# Patient Record
Sex: Male | Born: 1970 | Hispanic: No | Marital: Married | Smoking: Never smoker
Health system: Southern US, Community
[De-identification: ages and names within clinical notes are randomized; demographics above are authoritative.]

## PROBLEM LIST (undated history)

## (undated) DIAGNOSIS — I10 Essential (primary) hypertension: Secondary | ICD-10-CM

## (undated) DIAGNOSIS — E119 Type 2 diabetes mellitus without complications: Secondary | ICD-10-CM

## (undated) HISTORY — PX: NO PAST SURGERIES: SHX2092

---

## 2011-03-30 ENCOUNTER — Inpatient Hospital Stay (INDEPENDENT_AMBULATORY_CARE_PROVIDER_SITE_OTHER)
Admission: RE | Admit: 2011-03-30 | Discharge: 2011-03-30 | Disposition: A | Discharge status: Home / Self Care | Payer: Self-pay | Source: Ambulatory Visit | Attending: Emergency Medicine | Admitting: Emergency Medicine

## 2011-03-30 ENCOUNTER — Ambulatory Visit (INDEPENDENT_AMBULATORY_CARE_PROVIDER_SITE_OTHER): Payer: Self-pay

## 2011-03-30 DIAGNOSIS — J40 Bronchitis, not specified as acute or chronic: Secondary | ICD-10-CM

## 2011-03-30 DIAGNOSIS — M549 Dorsalgia, unspecified: Secondary | ICD-10-CM

## 2020-04-03 ENCOUNTER — Ambulatory Visit (INDEPENDENT_AMBULATORY_CARE_PROVIDER_SITE_OTHER): Payer: BC Managed Care – PPO | Admitting: Physician Assistant

## 2020-04-03 ENCOUNTER — Encounter: Payer: Self-pay | Admitting: Physician Assistant

## 2020-04-03 ENCOUNTER — Other Ambulatory Visit: Payer: Self-pay

## 2020-04-03 VITALS — BP 118/82 | HR 66 | Temp 97.7°F | Ht 64.5 in | Wt 155.0 lb

## 2020-04-03 DIAGNOSIS — E119 Type 2 diabetes mellitus without complications: Secondary | ICD-10-CM | POA: Diagnosis not present

## 2020-04-03 DIAGNOSIS — R5383 Other fatigue: Secondary | ICD-10-CM | POA: Diagnosis not present

## 2020-04-03 LAB — POCT URINALYSIS DIPSTICK
Bilirubin, UA: NEGATIVE
Blood, UA: NEGATIVE
Glucose, UA: POSITIVE — AB
Ketones, UA: NEGATIVE
Leukocytes, UA: NEGATIVE
Nitrite, UA: NEGATIVE
Protein, UA: NEGATIVE
Spec Grav, UA: 1.015 (ref 1.010–1.025)
Urobilinogen, UA: NEGATIVE E.U./dL — AB
pH, UA: 6 (ref 5.0–8.0)

## 2020-04-03 LAB — POCT UA - MICROALBUMIN: Microalbumin Ur, POC: NEGATIVE mg/L

## 2020-04-03 LAB — POCT CBG (FASTING - GLUCOSE)-MANUAL ENTRY: Glucose Fasting, POC: 472 mg/dL — AB (ref 70–99)

## 2020-04-03 MED ORDER — CONTOUR NEXT MONITOR W/DEVICE KIT: PACK | 0 refills | Status: AC

## 2020-04-03 MED ORDER — GLUCOSE BLOOD VI STRP: ORAL_STRIP | 1 refills | Status: DC

## 2020-04-03 MED ORDER — GLUCOSE BLOOD VI STRP: ORAL_STRIP | 1 refills | Status: AC

## 2020-04-03 MED ORDER — LANCETS MISC: 1 refills | Status: DC

## 2020-04-03 MED ORDER — CONTOUR NEXT MONITOR W/DEVICE KIT: PACK | 0 refills | Status: DC

## 2020-04-03 MED ORDER — LANCETS MISC: 1 refills | Status: AC

## 2020-04-03 MED ORDER — METFORMIN HCL 500 MG PO TABS: 500.0000 mg | ORAL_TABLET | Freq: Two times a day (BID) | ORAL | 2 refills | Status: DC

## 2020-04-03 NOTE — Progress Notes (Signed)
Acute Office Visit  Subjective:    Patient ID: Terry Bruce, male    DOB: 1971-04-06, 49 y.o.   MRN: [id number]  Chief Complaint  Patient presents with   Not feeling well    Fatigue, excessive thirst, frequent urination, vision changes    HPI Patient is in today for fatigue, visual changes and excessive thirst - all of these symptoms started in the past 2 weeks He has not been checking his glucose but does have a family history of IDDM Pt recently had eye exam in the past week  History reviewed. No pertinent past medical history.  History reviewed. No pertinent surgical history.  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  Tobacco Use   Smoking status: Never Smoker   Smokeless tobacco: Never Used  Scientific laboratory technician Use: Never used  Substance and Sexual Activity   Alcohol use: Not Currently   Drug use: Never   Sexual activity: Not on file  Other Topics Concern   Not on file  Social History Narrative   Not on file   Social Determinants of Health   Financial Resource Strain:    Difficulty of Paying Living Expenses:   Food Insecurity:    Worried About Charity fundraiser in the Last Year:    Arboriculturist in the Last Year:   Transportation Needs:    Film/video editor (Medical):    Lack of Transportation (Non-Medical):   Physical Activity:    Days of Exercise per Week:    Minutes of Exercise per Session:   Stress:    Feeling of Stress :   Social Connections:    Frequency of Communication with Friends and Family:    Frequency of Social Gatherings with Friends and Family:    Attends Religious Services:     Active Member of Clubs or Organizations:    Attends Music therapist:    Marital Status:   Intimate Partner Violence:    Fear of Current or Ex-Partner:    Emotionally Abused:    Physically Abused:    Sexually Abused:      Current Outpatient Medications:    Blood Glucose Monitoring Suppl (CONTOUR NEXT MONITOR) w/Device KIT, Check glucose fasting once daily in the morning., Disp: 1 kit, Rfl: 0   glucose blood test strip, Check glucose fasting once in the morning daily., Disp: 100 each, Rfl: 1   Lancets MISC, Check glucose fasting once in the morning., Disp: 100 each, Rfl: 1   metFORMIN (GLUCOPHAGE) 500 MG tablet, Take 1 tablet (500 mg total) by mouth 2 (two) times daily with a meal., Disp: 60 tablet, Rfl: 2   No Known Allergies  CONSTITUTIONAL: see HPI E/N/T: Negative for ear pain, nasal congestion and sore throat.  CARDIOVASCULAR: Negative for chest pain, dizziness, palpitations and pedal edema.  RESPIRATORY: Negative for recent cough and dyspnea.  GASTROINTESTINAL: Negative for abdominal pain, acid reflux symptoms, constipation, diarrhea, nausea and vomiting.  GU - polyuria and nocturia MSK: Negative for arthralgias and myalgias.  INTEGUMENTARY: Negative for rash.  NEUROLOGICAL: Negative for dizziness and headaches.  PSYCHIATRIC: Negative for sleep disturbance and to question depression screen.  Negative for depression, negative for anhedonia.         Objective:    PHYSICAL EXAM:   VS: BP 118/82 (BP Location: Right Arm, Patient Position: Sitting)    Pulse 66    Temp 97.7 F (36.5 C) (Temporal)    Ht 5' 4.5" (1.638 m)    Wt 155 lb (70.3 kg)    SpO2 97%    BMI 26.19 kg/m   GEN: Well nourished, well developed, in no acute distress   Cardiac: RRR; no murmurs, rubs, or gallops,no edema - no significant varicosities Respiratory:  normal respiratory rate and pattern with no distress - normal breath sounds with no rales, rhonchi, wheezes or rubs GI: normal  bowel sounds, no masses or tenderness Skin: warm and dry, no rash  Psych: euthymic mood, appropriate affect and demeanor    Office Visit on 04/03/2020  Component Date Value Ref Range Status   Glucose, UA 04/03/2020 Positive* Negative Final   Bilirubin, UA 04/03/2020 Negative   Final   Ketones, UA 04/03/2020 Negative   Final   Spec Grav, UA 04/03/2020 1.015  1.010 - 1.025 Final   Blood, UA 04/03/2020 Negative   Final   pH, UA 04/03/2020 6.0  5.0 - 8.0 Final   Protein, UA 04/03/2020 Negative  Negative Final   Urobilinogen, UA 04/03/2020 negative* 0.2 or 1.0 E.U./dL Final   Nitrite, UA 04/03/2020 Negative   Final   Leukocytes, UA 04/03/2020 Negative  Negative Final   Glucose Fasting, POC 04/03/2020 472* 70 - 99 mg/dL Final    Wt Readings from Last 3 Encounters:  04/03/20 155 lb (70.3 kg)    Health Maintenance Due  Topic Date Due   Hepatitis C Screening  Never done   URINE MICROALBUMIN  Never done   COVID-19 Vaccine (1) Never done   HIV Screening  Never done    There are no preventive care reminders to display for this patient.        Assessment & Plan:   Problem List Items Addressed This Visit      Other   Other fatigue - Primary   Relevant Orders   POCT urinalysis dipstick (Completed)   POCT CBG (Fasting - Glucose) (Completed)   CBC with Differential/Platelet   Comprehensive metabolic panel   TSH   Lipid panel   Hemoglobin A1c    Other Visit Diagnoses    New onset type 2 diabetes mellitus (HCC)       Relevant Medications   metFORMIN (GLUCOPHAGE) 500 MG tablet   Blood Glucose Monitoring Suppl (CONTOUR NEXT MONITOR) w/Device KIT   glucose blood test strip   Lancets MISC   Other Relevant Orders   CBC with Differential/Platelet   Comprehensive metabolic panel   TSH   Lipid panel   Hemoglobin A1c       Meds ordered this encounter  Medications   metFORMIN (GLUCOPHAGE) 500 MG tablet    Sig: Take 1 tablet (500 mg total) by mouth 2 (two)  times daily with a meal.    Dispense:  60 tablet    Refill:  2    Order Specific Question:   Supervising Provider    Answer:   Shelton Silvas   Blood Glucose Monitoring Suppl (CONTOUR NEXT MONITOR) w/Device KIT    Sig: Check glucose fasting once daily in the morning.    Dispense:  1 kit    Refill:  0  Please use Diagnosis Code- E11.9 Diabetes type 2   glucose blood test strip    Sig: Check glucose fasting once in the morning daily.    Dispense:  100 each    Refill:  1    Please use Diagnosis Code- E11.9 Diabetes type 2   Lancets MISC    Sig: Check glucose fasting once in the morning.    Dispense:  100 each    Refill:  1    Please use Diagnosis Code- E11.9 Diabetes type 2     SARA R Sarayah Bacchi, PA-C

## 2020-04-03 NOTE — Assessment & Plan Note (Addendum)
Diabetes education handouts given rx for glucometer and strips to start checking bid rx to start glucophage 500mg  bid Follow up in 2 weeks

## 2020-04-03 NOTE — Assessment & Plan Note (Signed)
labwork pending 

## 2020-04-03 NOTE — Patient Instructions (Signed)

## 2020-04-04 LAB — COMPREHENSIVE METABOLIC PANEL
ALT: 19 IU/L (ref 0–44)
AST: 16 IU/L (ref 0–40)
Albumin/Globulin Ratio: 1.6 (ref 1.2–2.2)
Albumin: 4.5 g/dL (ref 4.0–5.0)
Alkaline Phosphatase: 115 IU/L (ref 48–121)
BUN/Creatinine Ratio: 18 (ref 9–20)
BUN: 29 mg/dL — ABNORMAL HIGH (ref 6–24)
Bilirubin Total: 0.3 mg/dL (ref 0.0–1.2)
CO2: 22 mmol/L (ref 20–29)
Calcium: 9.5 mg/dL (ref 8.7–10.2)
Chloride: 95 mmol/L — ABNORMAL LOW (ref 96–106)
Creatinine, Ser: 1.57 mg/dL — ABNORMAL HIGH (ref 0.76–1.27)
GFR calc Af Amer: 60 mL/min/{1.73_m2} (ref >59)
GFR calc non Af Amer: 52 mL/min/{1.73_m2} — ABNORMAL LOW (ref >59)
Globulin, Total: 2.9 g/dL (ref 1.5–4.5)
Glucose: 430 mg/dL — ABNORMAL HIGH (ref 65–99)
Potassium: 5 mmol/L (ref 3.5–5.2)
Sodium: 131 mmol/L — ABNORMAL LOW (ref 134–144)
Total Protein: 7.4 g/dL (ref 6.0–8.5)

## 2020-04-04 LAB — HEMOGLOBIN A1C
Est. average glucose Bld gHb Est-mCnc: 263 mg/dL
Hgb A1c MFr Bld: 10.8 % — ABNORMAL HIGH (ref 4.8–5.6)

## 2020-04-04 LAB — CBC WITH DIFFERENTIAL/PLATELET
Basophils Absolute: 0 10*3/uL (ref 0.0–0.2)
Basos: 1 %
EOS (ABSOLUTE): 0.2 10*3/uL (ref 0.0–0.4)
Eos: 3 %
Hematocrit: 46.1 % (ref 37.5–51.0)
Hemoglobin: 15.1 g/dL (ref 13.0–17.7)
Immature Grans (Abs): 0 10*3/uL (ref 0.0–0.1)
Immature Granulocytes: 0 %
Lymphocytes Absolute: 2 10*3/uL (ref 0.7–3.1)
Lymphs: 34 %
MCH: 28.5 pg (ref 26.6–33.0)
MCHC: 32.8 g/dL (ref 31.5–35.7)
MCV: 87 fL (ref 79–97)
Monocytes Absolute: 0.5 10*3/uL (ref 0.1–0.9)
Monocytes: 8 %
Neutrophils Absolute: 3.2 10*3/uL (ref 1.4–7.0)
Neutrophils: 54 %
Platelets: 266 10*3/uL (ref 150–450)
RBC: 5.3 x10E6/uL (ref 4.14–5.80)
RDW: 12.8 % (ref 11.6–15.4)
WBC: 5.9 10*3/uL (ref 3.4–10.8)

## 2020-04-04 LAB — LIPID PANEL
Chol/HDL Ratio: 3.9 ratio (ref 0.0–5.0)
Cholesterol, Total: 210 mg/dL — ABNORMAL HIGH (ref 100–199)
HDL: 54 mg/dL (ref >39)
LDL Chol Calc (NIH): 137 mg/dL — ABNORMAL HIGH (ref 0–99)
Triglycerides: 108 mg/dL (ref 0–149)
VLDL Cholesterol Cal: 19 mg/dL (ref 5–40)

## 2020-04-04 LAB — TSH: TSH: 0.864 u[IU]/mL (ref 0.450–4.500)

## 2020-04-04 LAB — CARDIOVASCULAR RISK ASSESSMENT

## 2020-04-11 ENCOUNTER — Other Ambulatory Visit: Payer: Self-pay

## 2020-04-11 ENCOUNTER — Encounter: Payer: Self-pay | Admitting: Physician Assistant

## 2020-04-11 ENCOUNTER — Ambulatory Visit: Payer: BC Managed Care – PPO | Admitting: Physician Assistant

## 2020-04-11 VITALS — BP 102/72 | HR 64 | Temp 97.9°F | Ht 64.5 in | Wt 155.0 lb

## 2020-04-11 DIAGNOSIS — E119 Type 2 diabetes mellitus without complications: Secondary | ICD-10-CM | POA: Diagnosis not present

## 2020-04-11 NOTE — Progress Notes (Signed)
Acute Office Visit  Subjective:    Patient ID: Terry Bruce, male    DOB: 04/08/1971, 49 y.o.   MRN: [id number]  Chief Complaint  Patient presents with  . Diabetes    New Diabetes 1 month follow up    HPI Patient is in today for follow up diabetes - pt states he is doing well on his current medication and tolerating well - his glucose has been ranging now between 80s-130s Pt is due to recheck cmp today as well Voices no other concerns or problems  History reviewed. No pertinent past medical history.  History reviewed. No pertinent surgical history.  Family History  Problem Relation Age of Onset  . Diabetes Mellitus II Mother   . Arthritis Father     Social History   Socioeconomic History  . Marital status: Married    Spouse name: Not on file  . Number of children: 2  . Years of education: Not on file  . Highest education level: Not on file  Occupational History  . 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 Smoking status: Never Smoker  . Smokeless tobacco: Never Used  Vaping Use  . Vaping Use: Never used  Substance and Sexual Activity  . Alcohol use: Not Currently  . Drug use: Never  . Sexual activity: Not on file  Other Topics Concern  . Not on file  Social History Narrative  . Not on file   Social Determinants of Health   Financial Resource Strain:   . Difficulty of Paying Living Expenses:   Food Insecurity:   . Worried About Charity fundraiser in the Last Year:   . Arboriculturist in the Last Year:   Transportation Needs:   . Film/video editor (Medical):   Marland Kitchen Lack of Transportation (Non-Medical):   Physical Activity:   . Days of Exercise per Week:   . Minutes of Exercise per Session:   Stress:   . Feeling of Stress :   Social Connections:   . Frequency of Communication with Friends and Family:   . Frequency of Social Gatherings with Friends and Family:   . Attends Religious Services:   . Active Member of Clubs or  Organizations:   . Attends Archivist Meetings:   Marland Kitchen Marital Status:   Intimate Partner Violence:   . Fear of Current or Ex-Partner:   . Emotionally Abused:   Marland Kitchen Physically Abused:   . Sexually Abused:      Current Outpatient Medications:  .  Blood Glucose Monitoring Suppl (CONTOUR NEXT MONITOR) w/Device KIT, Check glucose fasting in am and once after supper in pm, Disp: 1 kit, Rfl: 0 .  glucose blood test strip, Check glucose bid, Disp: 100 each, Rfl: 1 .  Lancets MISC, Check glucose bid, Disp: 100 each, Rfl: 1 .  metFORMIN (GLUCOPHAGE) 500 MG tablet, Take 1 tablet (500 mg total) by mouth 2 (two) times daily with a meal., Disp: 60 tablet, Rfl: 2   No Known Allergies  CONSTITUTIONAL: Negative for chills, fatigue, fever, unintentional weight gain and unintentional weight loss.  CARDIOVASCULAR: Negative for chest pain, dizziness, palpitations and pedal edema.  RESPIRATORY: Negative for recent cough and dyspnea.  GASTROINTESTINAL: Negative for abdominal pain, acid reflux symptoms, constipation, diarrhea, nausea and vomiting.  PSYCHIATRIC: Negative for sleep disturbance and to question depression screen.  Negative for depression, negative for anhedonia.         Objective:    PHYSICAL EXAM:  VS: BP 102/72 (BP Location: Left Arm, Patient Position: Sitting)   Pulse 64   Temp 97.9 F (36.6 C) (Temporal)   Ht 5' 4.5" (1.638 m)   Wt 155 lb (70.3 kg)   SpO2 97%   BMI 26.19 kg/m   GEN: Well nourished, well developed, in no acute distress  Cardiac: RRR; no murmurs, rubs, or gallops,no edema -  Respiratory:  normal respiratory rate and pattern with no distress - normal breath sounds with no rales, rhonchi, wheezes or rubs Psych: euthymic mood, appropriate affect and demeanor   Wt Readings from Last 3 Encounters:  04/11/20 155 lb (70.3 kg)  04/03/20 155 lb (70.3 kg)    Health Maintenance Due  Topic Date Due  . Hepatitis C Screening  Never done  . PNEUMOCOCCAL  POLYSACCHARIDE VACCINE AGE 66-64 HIGH RISK  Never done  . FOOT EXAM  Never done  . COVID-19 Vaccine (1) Never done  . HIV Screening  Never done  . INFLUENZA VACCINE  04/06/2020    There are no preventive care reminders to display for this patient.        Assessment & Plan:   Problem List Items Addressed This Visit      Endocrine   New onset type 2 diabetes mellitus ([place]) - Primary    Continue current meds as directed Follow up fasting in 2 months      Relevant Orders   Comprehensive metabolic panel       No orders of the defined types were placed in this encounter.    SARA R Kylo Gavin, PA-C

## 2020-04-11 NOTE — Assessment & Plan Note (Signed)
Continue current meds as directed Follow up fasting in 2 months

## 2020-04-12 LAB — COMPREHENSIVE METABOLIC PANEL
ALT: 16 IU/L (ref 0–44)
AST: 20 IU/L (ref 0–40)
Albumin/Globulin Ratio: 1.4 (ref 1.2–2.2)
Albumin: 4.1 g/dL (ref 4.0–5.0)
Alkaline Phosphatase: 68 IU/L (ref 48–121)
BUN/Creatinine Ratio: 15 (ref 9–20)
BUN: 20 mg/dL (ref 6–24)
Bilirubin Total: 0.5 mg/dL (ref 0.0–1.2)
CO2: 24 mmol/L (ref 20–29)
Calcium: 9 mg/dL (ref 8.7–10.2)
Chloride: 103 mmol/L (ref 96–106)
Creatinine, Ser: 1.37 mg/dL — ABNORMAL HIGH (ref 0.76–1.27)
GFR calc Af Amer: 70 mL/min/{1.73_m2} (ref >59)
GFR calc non Af Amer: 61 mL/min/{1.73_m2} (ref >59)
Globulin, Total: 2.9 g/dL (ref 1.5–4.5)
Glucose: 113 mg/dL — ABNORMAL HIGH (ref 65–99)
Potassium: 4.8 mmol/L (ref 3.5–5.2)
Sodium: 139 mmol/L (ref 134–144)
Total Protein: 7 g/dL (ref 6.0–8.5)

## 2020-04-21 ENCOUNTER — Ambulatory Visit: Payer: BC Managed Care – PPO | Admitting: Physician Assistant

## 2020-06-11 ENCOUNTER — Ambulatory Visit (INDEPENDENT_AMBULATORY_CARE_PROVIDER_SITE_OTHER): Payer: Self-pay | Admitting: Physician Assistant

## 2020-06-11 ENCOUNTER — Other Ambulatory Visit: Payer: Self-pay

## 2020-06-11 ENCOUNTER — Encounter: Payer: Self-pay | Admitting: Physician Assistant

## 2020-06-11 VITALS — BP 112/72 | HR 61 | Temp 97.5°F | Ht 64.5 in | Wt 152.4 lb

## 2020-06-11 DIAGNOSIS — E119 Type 2 diabetes mellitus without complications: Secondary | ICD-10-CM

## 2020-06-11 NOTE — Progress Notes (Signed)
Acute Office Visit  Subjective:    Patient ID: Terry Bruce, male    DOB: 07-29-1971, 49 y.o.   MRN: [id number]  Chief Complaint  Patient presents with  . Diabetes    HPI Patient is in today for follow up diabetes - pt states he is doing well on his current medication and tolerating well - his fasting glucose has been ranging now between 80s-130s He still gets elevations over 200 postprandial esp when not eating well (rice, cake, etc) Voices no other concerns or problems  History reviewed. No pertinent past medical history.  History reviewed. No pertinent surgical history.  Family History  Problem Relation Age of Onset  . Diabetes Mellitus II Mother   . Arthritis Father     Social History   Socioeconomic History  . Marital status: Married    Spouse name: Not on file  . Number of children: 2  . Years of education: Not on file  . Highest education level: Not on file  Occupational History  . 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 Smoking status: Never Smoker  . Smokeless tobacco: Never Used  Vaping Use  . Vaping Use: Never used  Substance and Sexual Activity  . Alcohol use: Not Currently  . Drug use: Never  . Sexual activity: Not on file  Other Topics Concern  . Not on file  Social History Narrative  . Not on file   Social Determinants of Health   Financial Resource Strain:   . Difficulty of Paying Living Expenses: Not on file  Food Insecurity:   . Worried About Charity fundraiser in the Last Year: Not on file  . Ran Out of Food in the Last Year: Not on file  Transportation Needs:   . Lack of Transportation (Medical): Not on file  . Lack of Transportation (Non-Medical): Not on file  Physical Activity:   . Days of Exercise per Week: Not on file  . Minutes of Exercise per Session: Not on file  Stress:   . Feeling of Stress : Not on file  Social Connections:   . Frequency of Communication with Friends and Family: Not on file  .  Frequency of Social Gatherings with Friends and Family: Not on file  . Attends Religious Services: Not on file  . Active Member of Clubs or Organizations: Not on file  . Attends Archivist Meetings: Not on file  . Marital Status: Not on file  Intimate Partner Violence:   . Fear of Current or Ex-Partner: Not on file  . Emotionally Abused: Not on file  . Physically Abused: Not on file  . Sexually Abused: Not on file     Current Outpatient Medications:  .  Blood Glucose Monitoring Suppl (CONTOUR NEXT MONITOR) w/Device KIT, Check glucose fasting in am and once after supper in pm, Disp: 1 kit, Rfl: 0 .  glucose blood test strip, Check glucose bid, Disp: 100 each, Rfl: 1 .  Lancets MISC, Check glucose bid, Disp: 100 each, Rfl: 1 .  metFORMIN (GLUCOPHAGE) 500 MG tablet, Take 1 tablet (500 mg total) by mouth 2 (two) times daily with a meal., Disp: 60 tablet, Rfl: 2   No Known Allergies  CONSTITUTIONAL: Negative for chills, fatigue, fever, unintentional weight gain and unintentional weight loss.  CARDIOVASCULAR: Negative for chest pain, dizziness, palpitations and pedal edema.  RESPIRATORY: Negative for recent cough and dyspnea.  GASTROINTESTINAL: Negative for abdominal pain, acid reflux symptoms, constipation, diarrhea, nausea and  vomiting.  PSYCHIATRIC: Negative for sleep disturbance and to question depression screen.  Negative for depression, negative for anhedonia.         Objective:    PHYSICAL EXAM:   VS: BP 112/72 (BP Location: Left Arm, Patient Position: Sitting, Cuff Size: Normal)   Pulse 61   Temp (!) 97.5 F (36.4 C) (Temporal)   Ht 5' 4.5" (1.638 m)   Wt 152 lb 6.4 oz (69.1 kg)   SpO2 99%   BMI 25.76 kg/m   GEN: Well nourished, well developed, in no acute distress  Cardiac: RRR; no murmurs, rubs, or gallops,no edema -  Respiratory:  normal respiratory rate and pattern with no distress - normal breath sounds with no rales, rhonchi, wheezes or rubs Psych:  euthymic mood, appropriate affect and demeanor   Wt Readings from Last 3 Encounters:  06/11/20 152 lb 6.4 oz (69.1 kg)  04/11/20 155 lb (70.3 kg)  04/03/20 155 lb (70.3 kg)    Health Maintenance Due  Topic Date Due  . PNEUMOCOCCAL POLYSACCHARIDE VACCINE AGE 18-64 HIGH RISK  Never done  . INFLUENZA VACCINE  Never done    There are no preventive care reminders to display for this patient.        Assessment & Plan:   Problem List Items Addressed This Visit      Endocrine   New onset type 2 diabetes mellitus ([place]) - Primary    Continue current meds Continue to monitor glucose bid Follow up as scheduled          No orders of the defined types were placed in this encounter.    SARA R Tyree Fluharty, PA-C

## 2020-06-11 NOTE — Assessment & Plan Note (Signed)
Continue current meds Continue to monitor glucose bid Follow up as scheduled

## 2020-08-21 ENCOUNTER — Ambulatory Visit: Payer: Self-pay | Admitting: Physician Assistant

## 2020-08-28 ENCOUNTER — Encounter: Payer: Self-pay | Admitting: Physician Assistant

## 2020-08-28 ENCOUNTER — Ambulatory Visit: Payer: BC Managed Care – PPO | Admitting: Physician Assistant

## 2020-08-28 ENCOUNTER — Other Ambulatory Visit: Payer: Self-pay

## 2020-08-28 VITALS — BP 110/76 | HR 71 | Temp 97.0°F | Ht 64.5 in | Wt 148.2 lb

## 2020-08-28 DIAGNOSIS — R634 Abnormal weight loss: Secondary | ICD-10-CM | POA: Diagnosis not present

## 2020-08-28 DIAGNOSIS — E119 Type 2 diabetes mellitus without complications: Secondary | ICD-10-CM

## 2020-08-28 DIAGNOSIS — Z23 Encounter for immunization: Secondary | ICD-10-CM

## 2020-08-28 MED ORDER — METFORMIN HCL 1000 MG PO TABS: 1000.0000 mg | ORAL_TABLET | Freq: Two times a day (BID) | ORAL | 1 refills | Status: DC

## 2020-08-28 NOTE — Progress Notes (Signed)
Acute Office Visit  Subjective:    Patient ID: Terry Bruce, male    DOB: 02-Aug-1971, 49 y.o.   MRN: [id number]  Chief Complaint  Patient presents with   Diabetes    Follow up    HPI Patient is in today for follow up of diabetes - he is currently taking glucophage 500 mg bid - he is not watching diet or exercising - states he will tighten up after first of year His glucose has been ranging in 200s fasting and random glucose up in 300s at times  Patient has lost over 20 pounds this year - he states his appetite is good - denies nausea or vomiting, no melena or hematochezia - no family history of colorectal cancers Discussed with him with having diabetes newly found weight loss can happen - will continue to monitor however  History reviewed. No pertinent past medical history.  History reviewed. No pertinent surgical history.  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  Tobacco Use   Smoking status: Never Smoker   Smokeless tobacco: Never Used  Scientific laboratory technician Use: Never used  Substance and Sexual Activity   Alcohol use: Not Currently   Drug use: Never   Sexual activity: Not on file  Other Topics Concern   Not on file  Social History Narrative   Not on file   Social Determinants of Health   Financial Resource Strain: Not on file  Food Insecurity: Not on file  Transportation Needs: Not on file  Physical Activity: Not on file  Stress: Not on file  Social Connections: Not on file  Intimate Partner Violence: Not on file    Outpatient Medications Prior to Visit  Medication Sig Dispense Refill   Blood Glucose Monitoring Suppl  (CONTOUR NEXT MONITOR) w/Device KIT Check glucose fasting in am and once after supper in pm 1 kit 0   glucose blood test strip Check glucose bid 100 each 1   Lancets MISC Check glucose bid 100 each 1   metFORMIN (GLUCOPHAGE) 500 MG tablet Take 1 tablet (500 mg total) by mouth 2 (two) times daily with a meal. 60 tablet 2   No facility-administered medications prior to visit.    No Known Allergies  Review of Systems    CONSTITUTIONAL: see HPI E/N/T: Negative for ear pain, nasal congestion and sore throat.  CARDIOVASCULAR: Negative for chest pain, dizziness, palpitations and pedal edema.  RESPIRATORY: Negative for recent cough and dyspnea.  GASTROINTESTINAL: Negative for abdominal pain, acid reflux symptoms, constipation, diarrhea, nausea and vomiting.  MSK: Negative for arthralgias and myalgias.  INTEGUMENTARY: Negative for rash.  NEUROLOGICAL: Negative for dizziness and headaches.  PSYCHIATRIC: Negative for sleep disturbance and to question depression screen.  Negative for depression, negative for anhedonia.      Objective:    Physical Exam PHYSICAL EXAM:   VS: BP 110/76 (BP Location: Left Arm, Patient Position: Sitting, Cuff Size: Normal)    Pulse 71    Temp (!) 97 F (36.1 C) (Temporal)    Ht 5' 4.5" (1.638 m)    Wt 148 lb 3.2 oz (  67.2 kg)    SpO2 99%    BMI 25.05 kg/m   GEN: Well nourished, well developed, in no acute distress  Cardiac: RRR; no murmurs, rubs, or gallops,no edema - Respiratory:  normal respiratory rate and pattern with no distress - normal breath sounds with no rales, rhonchi, wheezes or rubs  Skin: warm and dry, no rash  Neuro:  Alert and Oriented x 3, Strength and sensation are intact - CN II-Xii grossly intact Psych: euthymic mood, appropriate affect and demeanor  BP 110/76 (BP Location: Left Arm, Patient Position: Sitting, Cuff Size: Normal)    Pulse 71    Temp (!) 97 F (36.1 C) (Temporal)    Ht 5' 4.5" (1.638 m)    Wt 148 lb 3.2 oz (67.2 kg)    SpO2  99%    BMI 25.05 kg/m  Wt Readings from Last 3 Encounters:  08/28/20 148 lb 3.2 oz (67.2 kg)  06/11/20 152 lb 6.4 oz (69.1 kg)  04/11/20 155 lb (70.3 kg)    Health Maintenance Due  Topic Date Due   PNEUMOCOCCAL POLYSACCHARIDE VACCINE AGE 85-64 HIGH RISK  Never done   INFLUENZA VACCINE  Never done    There are no preventive care reminders to display for this patient.   Lab Results  Component Value Date   TSH 0.864 04/03/2020   Lab Results  Component Value Date   WBC 5.9 04/03/2020   HGB 15.1 04/03/2020   HCT 46.1 04/03/2020   MCV 87 04/03/2020   PLT 266 04/03/2020   Lab Results  Component Value Date   NA 139 04/11/2020   K 4.8 04/11/2020   CO2 24 04/11/2020   GLUCOSE 113 (H) 04/11/2020   BUN 20 04/11/2020   CREATININE 1.37 (H) 04/11/2020   BILITOT 0.5 04/11/2020   ALKPHOS 68 04/11/2020   AST 20 04/11/2020   ALT 16 04/11/2020   PROT 7.0 04/11/2020   ALBUMIN 4.1 04/11/2020   CALCIUM 9.0 04/11/2020   Lab Results  Component Value Date   CHOL 210 (H) 04/03/2020   Lab Results  Component Value Date   HDL 54 04/03/2020   Lab Results  Component Value Date   LDLCALC 137 (H) 04/03/2020   Lab Results  Component Value Date   TRIG 108 04/03/2020   Lab Results  Component Value Date   CHOLHDL 3.9 04/03/2020   Lab Results  Component Value Date   HGBA1C 10.8 (H) 04/03/2020       Assessment & Plan:  1. New onset type 2 diabetes mellitus (HCC) - CBC with Differential/Platelet - Comprehensive metabolic panel - TSH - Lipid panel - Hemoglobin A1c - metFORMIN (GLUCOPHAGE) 1000 MG tablet; Take 1 tablet (1,000 mg total) by mouth 2 (two) times daily with a meal.  Dispense: 180 tablet; Refill: 1  2. Weight loss - CBC with Differential/Platelet - Comprehensive metabolic panel - TSH  3. Need for prophylactic vaccination and inoculation against influenza - Flu Vaccine QUAD 6+ mos PF IM (Fluarix Quad PF)  4. Need for prophylactic vaccination against  Streptococcus pneumoniae (pneumococcus) - Pneumococcal conjugate vaccine 13-valent    Meds ordered this encounter  Medications   metFORMIN (GLUCOPHAGE) 1000 MG tablet    Sig: Take 1 tablet (1,000 mg total) by mouth 2 (two) times daily with a meal.    Dispense:  180 tablet    Refill:  1    Order Specific Question:   Supervising Provider    AnswerRochel Brome [phone]  Orders Placed This Encounter  Procedures   Pneumococcal conjugate vaccine 13-valent   Flu Vaccine QUAD 6+ mos PF IM (Fluarix Quad PF)   CBC with Differential/Platelet   Comprehensive metabolic panel   TSH   Lipid panel   Hemoglobin A1c    Follow-up: Return in about 3 months (around 11/26/2020) for for chronic fasting and nurse visit 1 month weight check.  An After Visit Summary was printed and given to the patient.  Yetta Flock [facility] [phone]

## 2020-08-29 LAB — CBC WITH DIFFERENTIAL/PLATELET
Basophils Absolute: 0 10*3/uL (ref 0.0–0.2)
Basos: 0 %
EOS (ABSOLUTE): 0.1 10*3/uL (ref 0.0–0.4)
Eos: 3 %
Hematocrit: 45.3 % (ref 37.5–51.0)
Hemoglobin: 14.6 g/dL (ref 13.0–17.7)
Immature Grans (Abs): 0 10*3/uL (ref 0.0–0.1)
Immature Granulocytes: 0 %
Lymphocytes Absolute: 1.6 10*3/uL (ref 0.7–3.1)
Lymphs: 31 %
MCH: 26.9 pg (ref 26.6–33.0)
MCHC: 32.2 g/dL (ref 31.5–35.7)
MCV: 84 fL (ref 79–97)
Monocytes Absolute: 0.4 10*3/uL (ref 0.1–0.9)
Monocytes: 7 %
Neutrophils Absolute: 3 10*3/uL (ref 1.4–7.0)
Neutrophils: 59 %
Platelets: 296 10*3/uL (ref 150–450)
RBC: 5.42 x10E6/uL (ref 4.14–5.80)
RDW: 12 % (ref 11.6–15.4)
WBC: 5.1 10*3/uL (ref 3.4–10.8)

## 2020-08-29 LAB — COMPREHENSIVE METABOLIC PANEL
ALT: 13 IU/L (ref 0–44)
AST: 8 IU/L (ref 0–40)
Albumin/Globulin Ratio: 1.4 (ref 1.2–2.2)
Albumin: 4.2 g/dL (ref 4.0–5.0)
Alkaline Phosphatase: 74 IU/L (ref 44–121)
BUN/Creatinine Ratio: 15 (ref 9–20)
BUN: 19 mg/dL (ref 6–24)
Bilirubin Total: 0.3 mg/dL (ref 0.0–1.2)
CO2: 22 mmol/L (ref 20–29)
Calcium: 9.6 mg/dL (ref 8.7–10.2)
Chloride: 98 mmol/L (ref 96–106)
Creatinine, Ser: 1.25 mg/dL (ref 0.76–1.27)
GFR calc Af Amer: 78 mL/min/{1.73_m2} (ref >59)
GFR calc non Af Amer: 68 mL/min/{1.73_m2} (ref >59)
Globulin, Total: 2.9 g/dL (ref 1.5–4.5)
Glucose: 272 mg/dL — ABNORMAL HIGH (ref 65–99)
Potassium: 4.5 mmol/L (ref 3.5–5.2)
Sodium: 135 mmol/L (ref 134–144)
Total Protein: 7.1 g/dL (ref 6.0–8.5)

## 2020-08-29 LAB — CARDIOVASCULAR RISK ASSESSMENT

## 2020-08-29 LAB — LIPID PANEL
Chol/HDL Ratio: 2.9 ratio (ref 0.0–5.0)
Cholesterol, Total: 176 mg/dL (ref 100–199)
HDL: 60 mg/dL (ref >39)
LDL Chol Calc (NIH): 96 mg/dL (ref 0–99)
Triglycerides: 112 mg/dL (ref 0–149)
VLDL Cholesterol Cal: 20 mg/dL (ref 5–40)

## 2020-08-29 LAB — TSH: TSH: 1.03 u[IU]/mL (ref 0.450–4.500)

## 2020-08-29 LAB — HEMOGLOBIN A1C
Est. average glucose Bld gHb Est-mCnc: 295 mg/dL
Hgb A1c MFr Bld: 11.9 % — ABNORMAL HIGH (ref 4.8–5.6)

## 2020-09-02 ENCOUNTER — Other Ambulatory Visit: Payer: Self-pay | Admitting: Physician Assistant

## 2020-09-02 MED ORDER — EMPAGLIFLOZIN 10 MG PO TABS: 10.0000 mg | ORAL_TABLET | Freq: Every day | ORAL | 0 refills | Status: DC

## 2020-09-28 ENCOUNTER — Other Ambulatory Visit: Payer: Self-pay | Admitting: Physician Assistant

## 2020-09-29 ENCOUNTER — Ambulatory Visit: Payer: BC Managed Care – PPO

## 2020-09-29 ENCOUNTER — Other Ambulatory Visit: Payer: Self-pay | Admitting: Physician Assistant

## 2020-09-29 DIAGNOSIS — E119 Type 2 diabetes mellitus without complications: Secondary | ICD-10-CM

## 2020-09-29 MED ORDER — METFORMIN HCL 1000 MG PO TABS: 1000.0000 mg | ORAL_TABLET | Freq: Two times a day (BID) | ORAL | 1 refills | Status: DC

## 2020-10-01 ENCOUNTER — Other Ambulatory Visit: Payer: Self-pay | Admitting: Physician Assistant

## 2020-10-01 DIAGNOSIS — E119 Type 2 diabetes mellitus without complications: Secondary | ICD-10-CM

## 2020-10-01 MED ORDER — METFORMIN HCL 1000 MG PO TABS: 1000.0000 mg | ORAL_TABLET | Freq: Two times a day (BID) | ORAL | 1 refills | Status: DC

## 2020-11-28 ENCOUNTER — Encounter: Payer: Self-pay | Admitting: Physician Assistant

## 2020-11-28 ENCOUNTER — Ambulatory Visit: Payer: BC Managed Care – PPO | Admitting: Physician Assistant

## 2020-11-28 ENCOUNTER — Other Ambulatory Visit: Payer: Self-pay

## 2020-11-28 VITALS — BP 106/70 | HR 67 | Temp 97.2°F | Resp 16 | Ht 64.5 in | Wt 146.6 lb

## 2020-11-28 DIAGNOSIS — E1165 Type 2 diabetes mellitus with hyperglycemia: Secondary | ICD-10-CM

## 2020-11-28 DIAGNOSIS — E119 Type 2 diabetes mellitus without complications: Secondary | ICD-10-CM | POA: Diagnosis not present

## 2020-11-28 MED ORDER — ROSUVASTATIN CALCIUM 5 MG PO TABS: 5.0000 mg | ORAL_TABLET | Freq: Every day | ORAL | 3 refills | Status: DC

## 2020-11-28 NOTE — Progress Notes (Signed)
Subjective:  Patient ID: Terry Bruce, male    DOB: [birth year]  Age: 50 y.o. MRN: [id number]  Chief Complaint  Patient presents with  . Diabetes       HPI  pt with history of diabetes - pt is noncompliant - he does not watch his diet and eats what he wants He was supposed to have started jardiance according to last labwork and patient states he knew nothing about this (voicemail was left for patient) He has glucophage 1048m that he is suppposed to take bid - he rarely takes as directed and breaks pills in half and haphazardly changes his dosing despite he has been told his glucose will never get under control if he does not take meds as directed - he states he does this because he thinks medication is 'bad for him' and I explained uncontrolled diabetes can cause serious side effects and illness Current Outpatient Medications on File Prior to Visit  Medication Sig Dispense Refill  . Blood Glucose Monitoring Suppl (CONTOUR NEXT MONITOR) w/Device KIT Check glucose fasting in am and once after supper in pm 1 kit 0  . glucose blood test strip Check glucose bid 100 each 1  . Lancets MISC Check glucose bid 100 each 1  . metFORMIN (GLUCOPHAGE) 1000 MG tablet Take 1,000 mg by mouth 2 (two) times daily with a meal.     No current facility-administered medications on file prior to visit.   History reviewed. No pertinent past medical history. History reviewed. No pertinent surgical history.  Family History  Problem Relation Age of Onset  . Diabetes Mellitus II Mother   . Arthritis Father    Social History   Socioeconomic History  . Marital status: Married    Spouse name: Not on file  . Number of children: 2  . Years of education: Not on file  . Highest education level: Not on file  Occupational History  . 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 Smoking status: Never Smoker  . Smokeless tobacco: Never Used  Vaping Use  . Vaping Use: Never used  Substance and  Sexual Activity  . Alcohol use: Not Currently  . Drug use: Never  . Sexual activity: Not on file  Other Topics Concern  . Not on file  Social History Narrative  . Not on file   Social Determinants of Health   Financial Resource Strain: Not on file  Food Insecurity: Not on file  Transportation Needs: Not on file  Physical Activity: Not on file  Stress: Not on file  Social Connections: Not on file    Review of Systems CONSTITUTIONAL: Negative for chills, fatigue, fever, unintentional weight gain and unintentional weight loss.  CARDIOVASCULAR: Negative for chest pain, dizziness, palpitations and pedal edema.  RESPIRATORY: Negative for recent cough and dyspnea.  GASTROINTESTINAL: Negative for abdominal pain, acid reflux symptoms, constipation, diarrhea, nausea and vomiting.  PSYCHIATRIC: Negative for sleep disturbance and to question depression screen.  Negative for depression, negative for anhedonia.       Objective:  BP 106/70 (BP Location: Left Arm, Patient Position: Sitting, Cuff Size: Normal)   Pulse 67   Temp (!) 97.2 F (36.2 C) (Temporal)   Resp 16   Ht 5' 4.5" (1.638 m)   Wt 146 lb 9.6 oz (66.5 kg)   SpO2 98%   BMI 24.78 kg/m   BP/Weight 11/28/2020 08/28/2020 165/0/3546 Systolic BP [id number] Diastolic BP 70 76 72  Wt. (Lbs) 146.6  148.2 152.4  BMI 24.78 25.05 25.76    Physical Exam PHYSICAL EXAM:   VS: BP 106/70 (BP Location: Left Arm, Patient Position: Sitting, Cuff Size: Normal)   Pulse 67   Temp (!) 97.2 F (36.2 C) (Temporal)   Resp 16   Ht 5' 4.5" (1.638 m)   Wt 146 lb 9.6 oz (66.5 kg)   SpO2 98%   BMI 24.78 kg/m   GEN: Well nourished, well developed, in no acute distress  Cardiac: RRR; no murmurs, rubs, or gallops,no edema -  Respiratory:  normal respiratory rate and pattern with no distress - normal breath sounds with no rales, rhonchi, wheezes or rubs MS: no deformity or atrophy  Skin: warm and dry, no rash  Psych: euthymic mood,  appropriate affect and demeanor  Diabetic Foot Exam - Simple   No data filed      Lab Results  Component Value Date   WBC 5.1 08/28/2020   HGB 14.6 08/28/2020   HCT 45.3 08/28/2020   PLT 296 08/28/2020   GLUCOSE 272 (H) 08/28/2020   CHOL 176 08/28/2020   TRIG 112 08/28/2020   HDL 60 08/28/2020   LDLCALC 96 08/28/2020   ALT 13 08/28/2020   AST 8 08/28/2020   NA 135 08/28/2020   K 4.5 08/28/2020   CL 98 08/28/2020   CREATININE 1.25 08/28/2020   BUN 19 08/28/2020   CO2 22 08/28/2020   TSH 1.030 08/28/2020   HGBA1C 11.9 (H) 08/28/2020   MICROALBUR negative 04/03/2020      Assessment & Plan:   1. Type 2 diabetes mellitus with hyperglycemia, without long-term current use of insulin (HCC) - Comprehensive metabolic panel - Hemoglobin A1c - Lipid panel will adjust medications according to labwork Pt was agreeable to start crestor 56m qd    Meds ordered this encounter  Medications  . rosuvastatin (CRESTOR) 5 MG tablet    Sig: Take 1 tablet (5 mg total) by mouth daily.    Dispense:  30 tablet    Refill:  3    Order Specific Question:   Supervising Provider    Answer:Shelton Silvas   Orders Placed This Encounter  Procedures  . Comprehensive metabolic panel  . Hemoglobin A1c  . Lipid panel      Follow-up: Return in about 3 months (around 02/28/2021) for chronic fasting follow up.  An After Visit Summary was printed and given to the patient.  [facility] ([phone]

## 2020-11-29 LAB — LIPID PANEL
Chol/HDL Ratio: 2.9 ratio (ref 0.0–5.0)
Cholesterol, Total: 209 mg/dL — ABNORMAL HIGH (ref 100–199)
HDL: 73 mg/dL (ref >39)
LDL Chol Calc (NIH): 127 mg/dL — ABNORMAL HIGH (ref 0–99)
Triglycerides: 48 mg/dL (ref 0–149)
VLDL Cholesterol Cal: 9 mg/dL (ref 5–40)

## 2020-11-29 LAB — COMPREHENSIVE METABOLIC PANEL
ALT: 26 IU/L (ref 0–44)
AST: 20 IU/L (ref 0–40)
Albumin/Globulin Ratio: 1.8 (ref 1.2–2.2)
Albumin: 4.3 g/dL (ref 4.0–5.0)
Alkaline Phosphatase: 76 IU/L (ref 44–121)
BUN/Creatinine Ratio: 15 (ref 9–20)
BUN: 17 mg/dL (ref 6–24)
Bilirubin Total: 0.4 mg/dL (ref 0.0–1.2)
CO2: 21 mmol/L (ref 20–29)
Calcium: 9.3 mg/dL (ref 8.7–10.2)
Chloride: 100 mmol/L (ref 96–106)
Creatinine, Ser: 1.11 mg/dL (ref 0.76–1.27)
Globulin, Total: 2.4 g/dL (ref 1.5–4.5)
Glucose: 228 mg/dL — ABNORMAL HIGH (ref 65–99)
Potassium: 4.3 mmol/L (ref 3.5–5.2)
Sodium: 138 mmol/L (ref 134–144)
Total Protein: 6.7 g/dL (ref 6.0–8.5)
eGFR: 82 mL/min/{1.73_m2} (ref >59)

## 2020-11-29 LAB — CARDIOVASCULAR RISK ASSESSMENT

## 2020-11-29 LAB — HEMOGLOBIN A1C
Est. average glucose Bld gHb Est-mCnc: 306 mg/dL
Hgb A1c MFr Bld: 12.3 % — ABNORMAL HIGH (ref 4.8–5.6)

## 2020-12-10 ENCOUNTER — Other Ambulatory Visit: Payer: Self-pay | Admitting: Physician Assistant

## 2020-12-10 DIAGNOSIS — E119 Type 2 diabetes mellitus without complications: Secondary | ICD-10-CM

## 2020-12-10 MED ORDER — EMPAGLIFLOZIN 10 MG PO TABS: 10.0000 mg | ORAL_TABLET | Freq: Every day | ORAL | 2 refills | Status: DC

## 2021-03-02 ENCOUNTER — Ambulatory Visit: Payer: BC Managed Care – PPO | Admitting: Physician Assistant

## 2021-03-05 ENCOUNTER — Other Ambulatory Visit: Payer: Self-pay

## 2021-03-05 ENCOUNTER — Other Ambulatory Visit: Payer: Self-pay | Admitting: Physician Assistant

## 2021-03-05 ENCOUNTER — Telehealth: Payer: Self-pay

## 2021-03-05 DIAGNOSIS — E119 Type 2 diabetes mellitus without complications: Secondary | ICD-10-CM

## 2021-03-05 NOTE — Telephone Encounter (Signed)
[phone] Wife called back stating that they do not have insurance. The empagliflozin (JARDIANCE) 10 MG TABS tablet is over $500...she is wanting to know if something else could be sent in that is the same as Jardiance but cheaper. Please have someone call her back.

## 2021-03-11 ENCOUNTER — Other Ambulatory Visit: Payer: Self-pay | Admitting: Physician Assistant

## 2021-03-11 MED ORDER — ROSUVASTATIN CALCIUM 5 MG PO TABS: 5.0000 mg | ORAL_TABLET | Freq: Every day | ORAL | 3 refills | Status: DC

## 2021-03-11 NOTE — Telephone Encounter (Signed)
Called and spoke to pt wife, she stated he is taking medication twice daily. They had to cancel his appointment due to her insurance dropping and not picking back up till Sept, she wanted to know if you can call him in a refill of his medications.

## 2021-03-11 NOTE — Telephone Encounter (Signed)
Patient wife sated he has metformin since he wasn't taking it, he needs a refill Crestor 5mg , he needs Jardiance or something close to it that wouldn't cost them 500 dollars. His sugar has been running between 109-115 with all medication, he has not checked it since ran out of Jardiance.

## 2021-03-16 NOTE — Telephone Encounter (Signed)
Patient made aware of information, verbalized understanding and will call back with log in two weeks.

## 2021-05-04 ENCOUNTER — Encounter (HOSPITAL_BASED_OUTPATIENT_CLINIC_OR_DEPARTMENT_OTHER): Payer: Self-pay | Admitting: Emergency Medicine

## 2021-05-04 ENCOUNTER — Emergency Department (HOSPITAL_BASED_OUTPATIENT_CLINIC_OR_DEPARTMENT_OTHER)
Admission: EM | Admit: 2021-05-04 | Discharge: 2021-05-05 | Disposition: A | Discharge status: Home / Self Care | Payer: Self-pay | Attending: Emergency Medicine | Admitting: Emergency Medicine

## 2021-05-04 ENCOUNTER — Emergency Department (HOSPITAL_BASED_OUTPATIENT_CLINIC_OR_DEPARTMENT_OTHER): Payer: Self-pay

## 2021-05-04 ENCOUNTER — Emergency Department (HOSPITAL_BASED_OUTPATIENT_CLINIC_OR_DEPARTMENT_OTHER): Payer: Self-pay | Admitting: Radiology

## 2021-05-04 ENCOUNTER — Other Ambulatory Visit: Payer: Self-pay

## 2021-05-04 DIAGNOSIS — E1165 Type 2 diabetes mellitus with hyperglycemia: Secondary | ICD-10-CM | POA: Insufficient documentation

## 2021-05-04 DIAGNOSIS — R202 Paresthesia of skin: Secondary | ICD-10-CM | POA: Insufficient documentation

## 2021-05-04 DIAGNOSIS — I10 Essential (primary) hypertension: Secondary | ICD-10-CM | POA: Insufficient documentation

## 2021-05-04 DIAGNOSIS — R739 Hyperglycemia, unspecified: Secondary | ICD-10-CM

## 2021-05-04 DIAGNOSIS — Z7984 Long term (current) use of oral hypoglycemic drugs: Secondary | ICD-10-CM | POA: Insufficient documentation

## 2021-05-04 HISTORY — DX: Type 2 diabetes mellitus without complications: E11.9

## 2021-05-04 HISTORY — DX: Essential (primary) hypertension: I10

## 2021-05-04 LAB — CBC
HCT: 39.8 % (ref 39.0–52.0)
Hemoglobin: 13.9 g/dL (ref 13.0–17.0)
MCH: 28.2 pg (ref 26.0–34.0)
MCHC: 34.9 g/dL (ref 30.0–36.0)
MCV: 80.7 fL (ref 80.0–100.0)
Platelets: 340 10*3/uL (ref 150–400)
RBC: 4.93 MIL/uL (ref 4.22–5.81)
RDW: 11.7 % (ref 11.5–15.5)
WBC: 6.2 10*3/uL (ref 4.0–10.5)
nRBC: 0 % (ref 0.0–0.2)

## 2021-05-04 LAB — CBG MONITORING, ED
Glucose-Capillary: 504 mg/dL (ref 70–99)
Glucose-Capillary: 476 mg/dL — ABNORMAL HIGH (ref 70–99)

## 2021-05-04 LAB — URINALYSIS, ROUTINE W REFLEX MICROSCOPIC
Bilirubin Urine: NEGATIVE
Glucose, UA: >1000 mg/dL — AB
Hgb urine dipstick: NEGATIVE
Ketones, ur: 40 mg/dL — AB
Leukocytes,Ua: NEGATIVE
Nitrite: NEGATIVE
Protein, ur: NEGATIVE mg/dL
Specific Gravity, Urine: 1.04 — ABNORMAL HIGH (ref 1.005–1.030)
pH: 5 (ref 5.0–8.0)

## 2021-05-04 LAB — COMPREHENSIVE METABOLIC PANEL
ALT: 15 U/L (ref 0–44)
AST: 15 U/L (ref 15–41)
Albumin: 4.2 g/dL (ref 3.5–5.0)
Alkaline Phosphatase: 60 U/L (ref 38–126)
Anion gap: 13 (ref 5–15)
BUN: 29 mg/dL — ABNORMAL HIGH (ref 6–20)
CO2: 22 mmol/L (ref 22–32)
Calcium: 9.4 mg/dL (ref 8.9–10.3)
Chloride: 95 mmol/L — ABNORMAL LOW (ref 98–111)
Creatinine, Ser: 1.24 mg/dL (ref 0.61–1.24)
GFR, Estimated: >60 mL/min (ref >60)
Glucose, Bld: 412 mg/dL — ABNORMAL HIGH (ref 70–99)
Potassium: 4.3 mmol/L (ref 3.5–5.1)
Sodium: 130 mmol/L — ABNORMAL LOW (ref 135–145)
Total Bilirubin: 0.4 mg/dL (ref 0.3–1.2)
Total Protein: 7.3 g/dL (ref 6.5–8.1)

## 2021-05-04 LAB — TSH: TSH: 0.544 u[IU]/mL (ref 0.350–4.500)

## 2021-05-04 MED ORDER — SODIUM CHLORIDE 0.9 % IV BOLUS
1000.0000 mL | Freq: Once | INTRAVENOUS | Status: AC
  Administered 2021-05-04: 1000 mL via INTRAVENOUS

## 2021-05-04 MED ORDER — INSULIN ASPART 100 UNIT/ML IJ SOLN
6.0000 [IU] | Freq: Once | INTRAMUSCULAR | Status: AC
  Administered 2021-05-04: 6 [IU] via INTRAVENOUS

## 2021-05-04 NOTE — ED Provider Notes (Signed)
[facility] DEPT Provider Note   CSN: [id number] Arrival date & time: 05/04/21  1550     History Chief Complaint  Patient presents with   Numbness    Terry Bruce is a 50 y.o. male.  HPI Patient presents with generalized weakness, and tingling on his left side..  States has had tingling in his left arm and leg.  Comes and goes but is coming more frequently.  No difficulty speaking.  Sometimes states he gets a little slurred speech however.  States over the last year he is lost around 40 pounds.  States that he is been told is due to his diabetes.  Has been rather poorly controlled.  Seen at PCP and sent in today.  Had occasional headaches.  He has been urinating frequently.  He does not check his sugars frequently.  States he has not checked in the last 3 weeks because he has been eating better.  Reportedly falling asleep frequently also.  Decreased energy    Past Medical History:  Diagnosis Date   Hypertension    Type 2 diabetes mellitus [facility])     Patient Active Problem List   Diagnosis Date Noted   Other fatigue 04/03/2020   New onset type 2 diabetes mellitus ([place]) 04/03/2020    History reviewed. No pertinent surgical history.     Family History  Problem Relation Age of Onset   Diabetes Mellitus II Mother    Arthritis Father     Social History   Tobacco Use   Smoking status: Never   Smokeless tobacco: Never  Vaping Use   Vaping Use: Never used  Substance Use Topics   Alcohol use: Not Currently   Drug use: Never    Home Medications Prior to Admission medications   Medication Sig Start Date End Date Taking? Authorizing Provider  sitaGLIPtin (JANUVIA) 100 MG tablet Take 1 tablet (100 mg total) by mouth daily. 05/05/21  Yes Davonna Belling, MD  Blood Glucose Monitoring Suppl (CONTOUR NEXT MONITOR) w/Device KIT Check glucose fasting in am and once after supper in pm 04/03/20   Marge Duncans, PA-C  glucose blood test strip Check glucose bid  04/03/20   Marge Duncans, PA-C  Lancets MISC Check glucose bid 04/03/20   Marge Duncans, PA-C  metFORMIN (GLUCOPHAGE) 1000 MG tablet Take 1,000 mg by mouth 2 (two) times daily with a meal.    [provider]  rosuvastatin (CRESTOR) 5 MG tablet Take 1 tablet (5 mg total) by mouth daily. 03/11/21   Marge Duncans, PA-C    Allergies    Patient has no known allergies.  Review of Systems   Review of Systems  Constitutional:  Positive for fatigue and unexpected weight change. Negative for appetite change.  HENT:  Negative for congestion.   Cardiovascular:  Negative for chest pain.  Gastrointestinal:  Negative for abdominal distention.  Genitourinary:  Negative for flank pain.  Musculoskeletal:  Negative for back pain.  Skin:  Negative for rash.  Neurological:  Negative for weakness.       Tingling the left side.  Psychiatric/Behavioral:  Negative for confusion.    Physical Exam Updated Vital Signs BP 115/88   Pulse 64   Temp 97.7 F (36.5 C) (Temporal)   Resp 20   Ht _0  (1.651 m)   Wt 59.9 kg   SpO2 99%   BMI 21.97 kg/m   Physical Exam Vitals and nursing note reviewed.  HENT:     Head: Atraumatic.  Eyes:  Extraocular Movements: Extraocular movements intact.     Pupils: Pupils are equal, round, and reactive to light.  Cardiovascular:     Rate and Rhythm: Regular rhythm.  Pulmonary:     Breath sounds: No wheezing or rhonchi.  Abdominal:     Tenderness: There is no abdominal tenderness.  Musculoskeletal:        General: No tenderness.     Cervical back: Neck supple. No tenderness.  Skin:    General: Skin is warm.     Capillary Refill: Capillary refill takes less than 2 seconds.  Neurological:     Mental Status: He is alert.     Comments: Subjective paresthesias to left upper and lower extremity.  Good strength.  Good range of motion.  States it feels tingly.    ED Results / Procedures / Treatments   Labs (all labs ordered are listed, but only abnormal results  are displayed) Labs Reviewed  URINALYSIS, ROUTINE W REFLEX MICROSCOPIC - Abnormal; Notable for the following components:      Result Value   Specific Gravity, Urine 1.040 (*)    Glucose, UA >1,000 (*)    Ketones, ur 40 (*)    All other components within normal limits  COMPREHENSIVE METABOLIC PANEL - Abnormal; Notable for the following components:   Sodium 130 (*)    Chloride 95 (*)    Glucose, Bld 412 (*)    BUN 29 (*)    All other components within normal limits  CBG MONITORING, ED - Abnormal; Notable for the following components:   Glucose-Capillary 504 (*)    All other components within normal limits  CBG MONITORING, ED - Abnormal; Notable for the following components:   Glucose-Capillary 476 (*)    All other components within normal limits  CBG MONITORING, ED - Abnormal; Notable for the following components:   Glucose-Capillary 276 (*)    All other components within normal limits  CBC  TSH    EKG None  Radiology DG Chest 2 View  Result Date: 05/04/2021 CLINICAL DATA:  Weight loss EXAM: CHEST - 2 VIEW COMPARISON:  Chest x-ray 03/30/2011 FINDINGS: The heart and mediastinal contours are within normal limits. No focal consolidation. No pulmonary edema. No pleural effusion. No pneumothorax. No acute osseous abnormality. IMPRESSION: No active cardiopulmonary disease. Electronically Signed   By: Iven Finn M.D.   On: 05/04/2021 19:41   CT HEAD WO CONTRAST (5MM)  Result Date: 05/04/2021 CLINICAL DATA:  Left-sided numbness EXAM: CT HEAD WITHOUT CONTRAST TECHNIQUE: Contiguous axial images were obtained from the base of the skull through the vertex without intravenous contrast. COMPARISON:  None. FINDINGS: Brain: No evidence of acute infarction, hemorrhage, hydrocephalus, extra-axial collection or mass lesion/mass effect. Vascular: No hyperdense vessel or unexpected calcification. Skull: Normal. Negative for fracture or focal lesion. Sinuses/Orbits: No acute finding. Other: None.  IMPRESSION: No acute intracranial abnormality noted. Electronically Signed   By: Inez Catalina M.D.   On: 05/04/2021 16:54    Procedures Procedures   Medications Ordered in ED Medications  sodium chloride 0.9 % bolus 1,000 mL (0 mLs Intravenous Stopped 05/04/21 2312)  sodium chloride 0.9 % bolus 1,000 mL (0 mLs Intravenous Stopped 05/05/21 0031)  insulin aspart (novoLOG) injection 6 Units (6 Units Intravenous Given 05/04/21 2327)    ED Course  I have reviewed the triage vital signs and the nursing notes.  Pertinent labs & imaging results that were available during my care of the patient were reviewed by me and considered in my medical  decision making (see chart for details).    MDM Rules/Calculators/A&P                           Patient with hyperglycemia.  Has been diabetic for about a year now but has been somewhat poorly controlled.  Sugar 500.  Has numbness and tingling in left arm and left leg.  Has been going for a while.  Subjective tingling without actual weakness or loss of sensation.  Head CT done reassuring.  Basic lab work reassuring but was hyperglycemic.  Treated with fluids and insulin to get down.  Discussed with pharmacy and will add Januvia to his metformin.  Will follow with new PCP.  TSH also reassuring.  Patient has lost around 40 pounds over the last year.  Will discharge. Final Clinical Impression(s) / ED Diagnoses Final diagnoses:  Hyperglycemia  Paresthesias    Rx / DC Orders ED Discharge Orders          Ordered    sitaGLIPtin (JANUVIA) 100 MG tablet  Daily        05/05/21 0018             Davonna Belling, MD 05/05/21 1600

## 2021-05-04 NOTE — ED Triage Notes (Addendum)
Pt arrives to ED with c/o of left sided numbness and tingling. Pt reports intermittent left arm and leg numbness x3 weeks. Pt denies any pain in the arm & leg. Pt reports this happened gradually. No color changes or swelling. No speech or vision deficits. However pt reports 40lb unexplained weight loss over the past year.

## 2021-05-04 NOTE — ED Notes (Signed)
CBG 507, Dr. Rubin Payor notified.

## 2021-05-05 LAB — CBG MONITORING, ED: Glucose-Capillary: 276 mg/dL — ABNORMAL HIGH (ref 70–99)

## 2021-05-05 MED ORDER — SITAGLIPTIN PHOSPHATE 100 MG PO TABS: 100.0000 mg | ORAL_TABLET | Freq: Every day | ORAL | 0 refills | Status: DC

## 2021-05-05 NOTE — Discharge Instructions (Addendum)
Follow-up with your new doctor.  Start the new medicine.  You may need other adjustment of your medications

## 2021-06-14 ENCOUNTER — Other Ambulatory Visit: Payer: Self-pay | Admitting: Physician Assistant

## 2021-08-01 ENCOUNTER — Other Ambulatory Visit: Payer: Self-pay | Admitting: Physician Assistant

## 2021-11-19 ENCOUNTER — Other Ambulatory Visit: Payer: Self-pay | Admitting: Physician Assistant

## 2022-09-27 IMAGING — CT CT HEAD W/O CM
4 series · 17 of 47 positions shown, 19 images · non-contrast
Comparison: None.

CLINICAL DATA: Left-sided numbness

EXAM:
CT HEAD WITHOUT CONTRAST
TECHNIQUE: Contiguous axial images were obtained from the base of the skull
through the vertex without intravenous contrast.

[Series 2: head wo · axial · 0.40mm/px · z∈[-393,-283]mm · 7 of 30 slices shown, 9 images]
[im 4/30  brain]
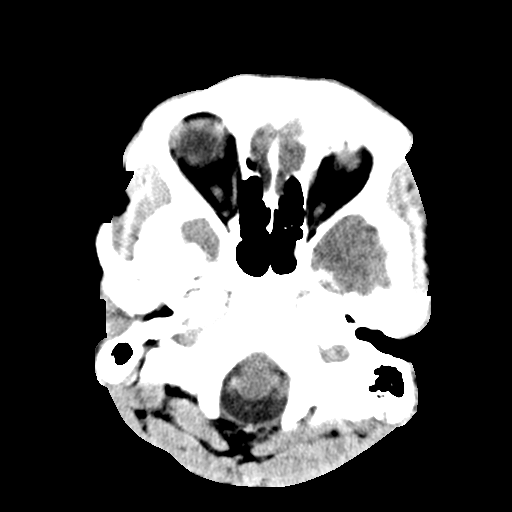
[im 4/30  bone]
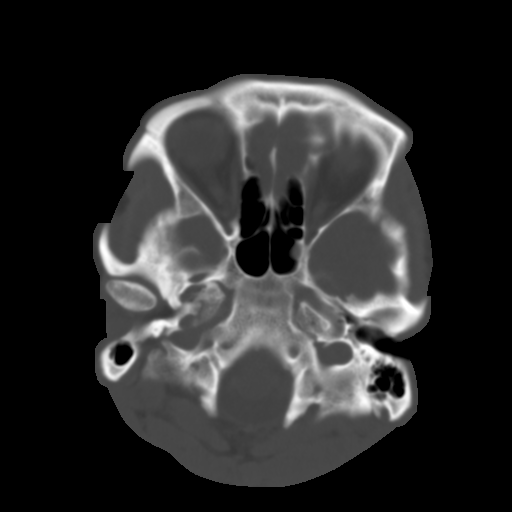
[im 8/30  brain]
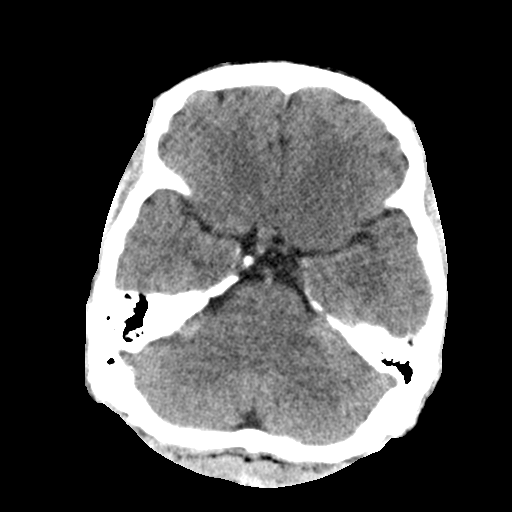
[im 11/30  brain]
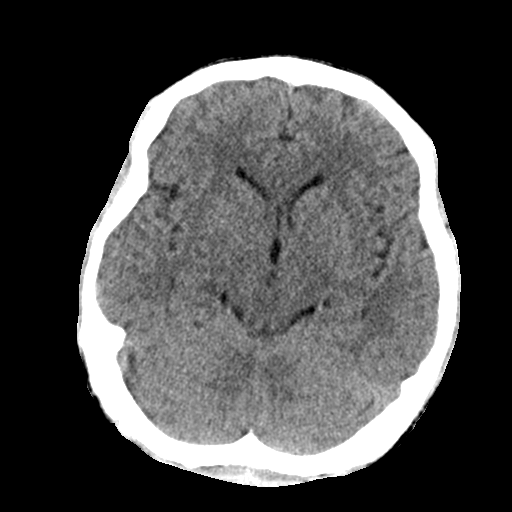
[im 15/30  brain]
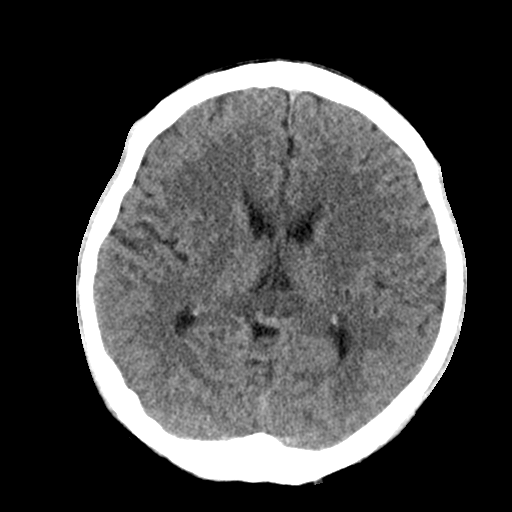
[im 19/30  brain]
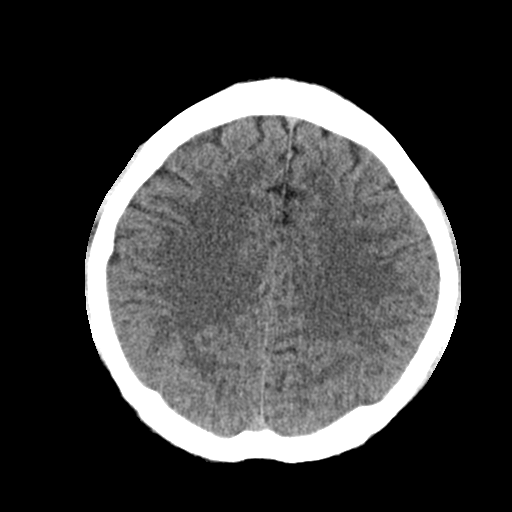
[im 19/30  bone]
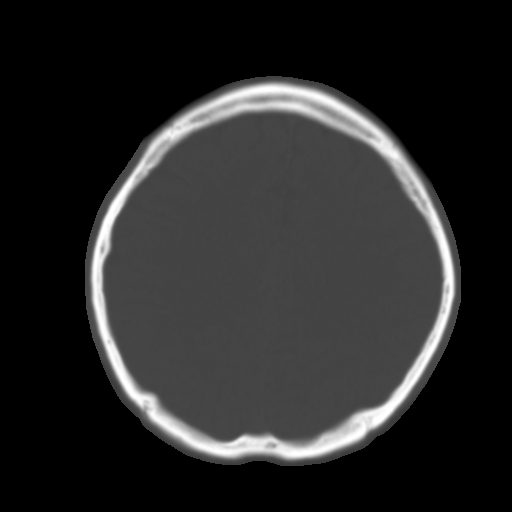
[im 22/30  brain]
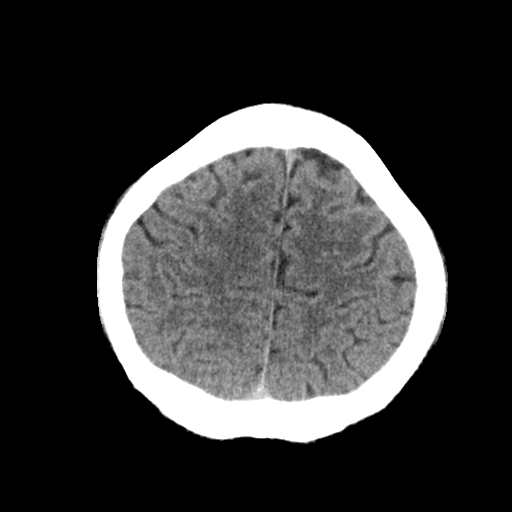
[im 26/30  brain]
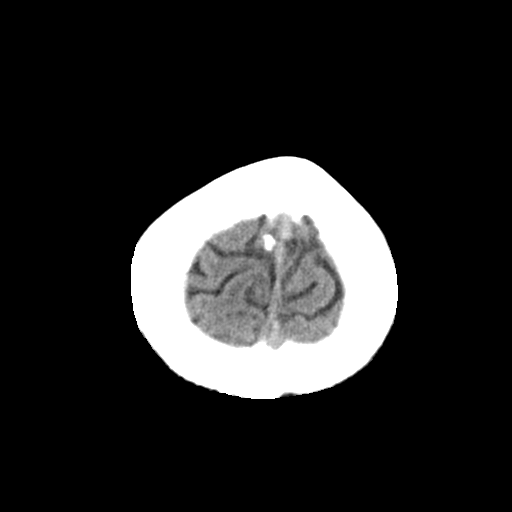

[Series 3: head bone · axial · 0.40mm/px · z∈[-394,-342]mm · 4 of 75 slices shown]
[im 8/75  bone]
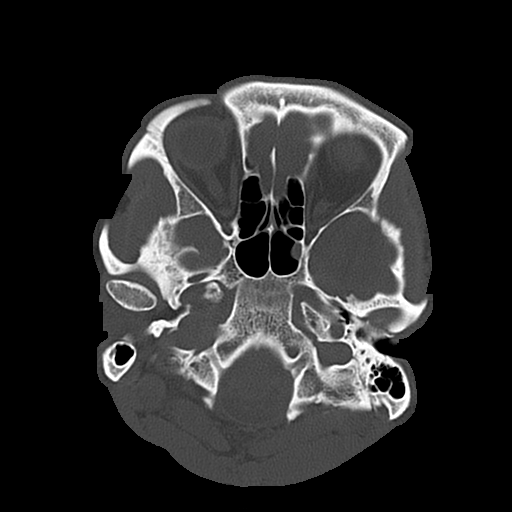
[im 15/75  bone]
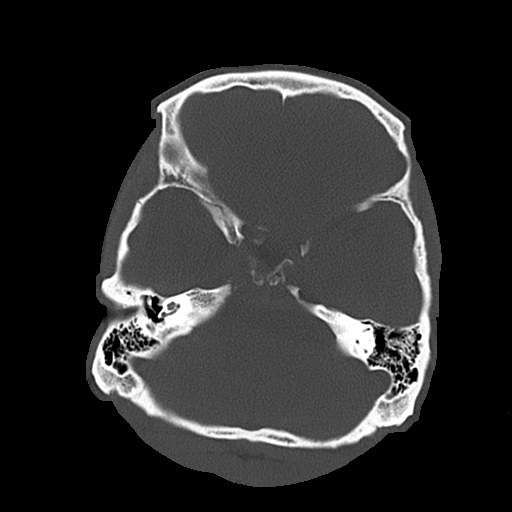
[im 23/75  bone]
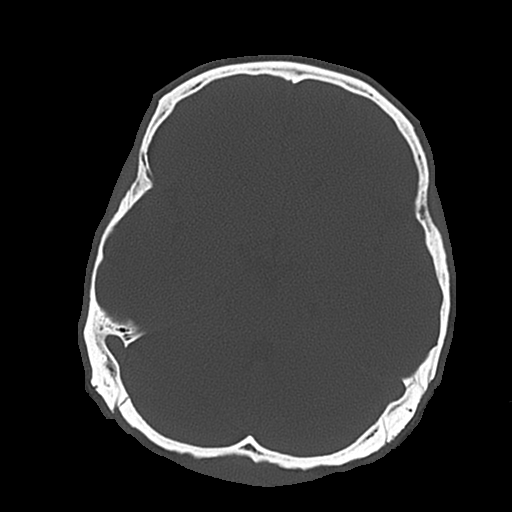
[im 34/75  bone]
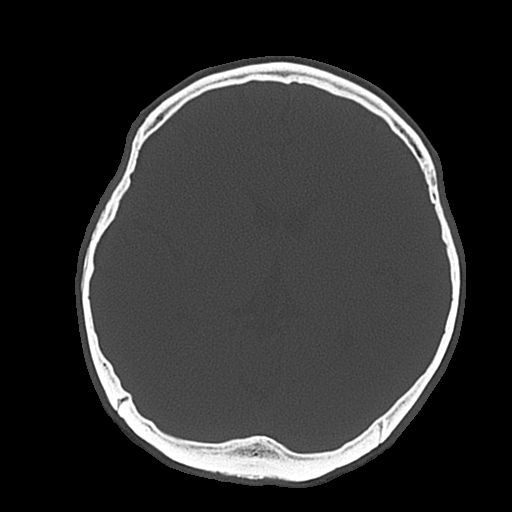

[Series 4: coronal soft · coronal · 0.33mm/px · 3 of 61 slices shown]
[im 21/61  brain]
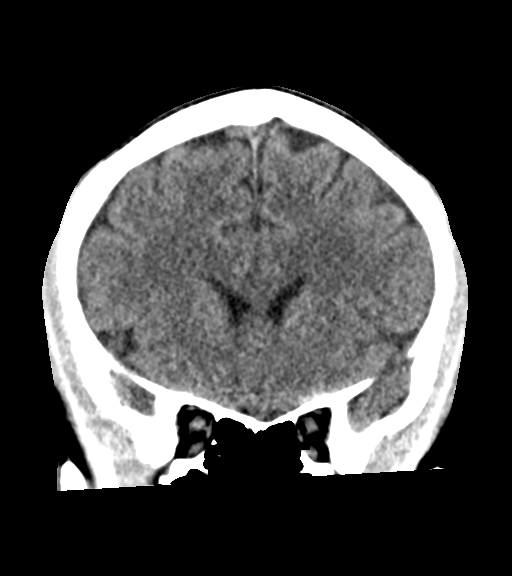
[im 27/61  brain]
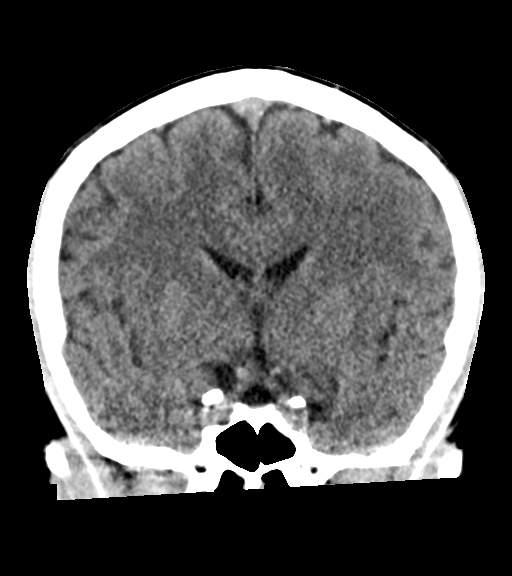
[im 34/61  brain]
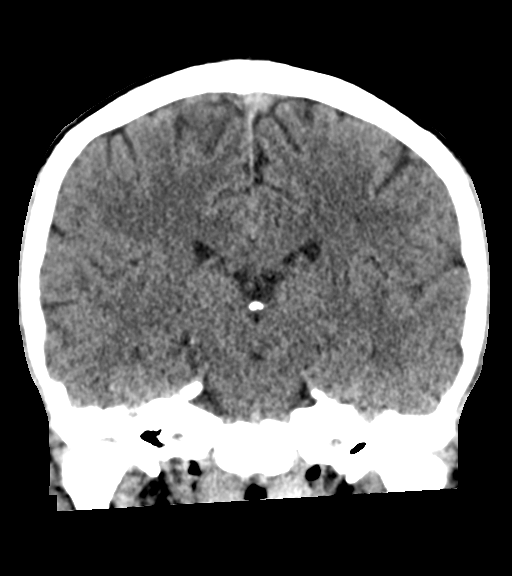

[Series 5: sagittal soft · sagittal · 0.34mm/px · 3 of 56 slices shown]
[im 19/56  brain]
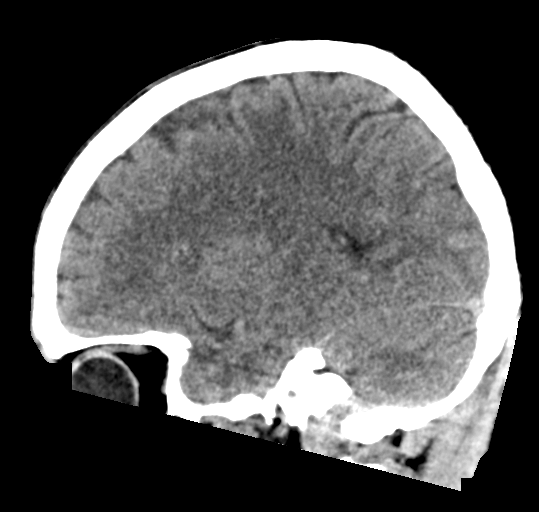
[im 28/56  brain]
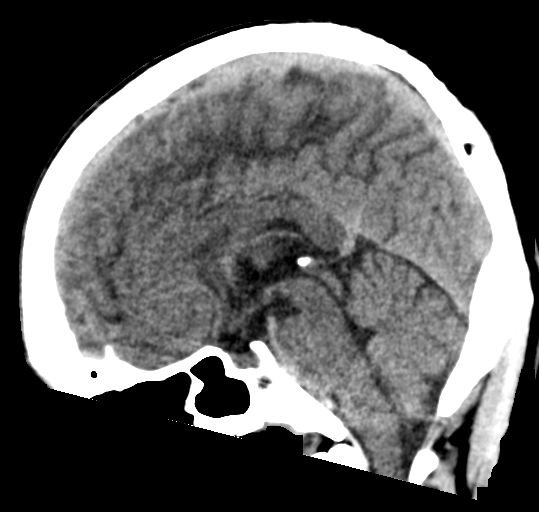
[im 37/56  brain]
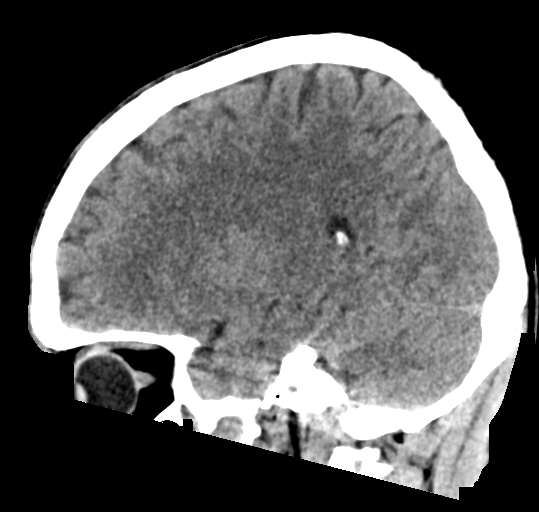

[17 of 47 positions shown; findings below may reference images not displayed]

FINDINGS: Brain: No evidence of acute infarction, hemorrhage, hydrocephalus,
extra-axial collection or mass lesion/mass effect.

Vascular: No hyperdense vessel or unexpected calcification.

Skull: Normal. Negative for fracture or focal lesion.

Sinuses/Orbits: No acute finding.

Other: None.
IMPRESSION: No acute intracranial abnormality noted.

## 2022-11-01 ENCOUNTER — Ambulatory Visit (INDEPENDENT_AMBULATORY_CARE_PROVIDER_SITE_OTHER): Payer: BC Managed Care – PPO | Admitting: Physician Assistant

## 2022-11-01 ENCOUNTER — Encounter: Payer: Self-pay | Admitting: Physician Assistant

## 2022-11-01 VITALS — BP 112/70 | HR 69 | Temp 97.6°F | Ht 66.0 in | Wt 143.2 lb

## 2022-11-01 DIAGNOSIS — Z1211 Encounter for screening for malignant neoplasm of colon: Secondary | ICD-10-CM

## 2022-11-01 DIAGNOSIS — E1165 Type 2 diabetes mellitus with hyperglycemia: Secondary | ICD-10-CM | POA: Diagnosis not present

## 2022-11-01 DIAGNOSIS — Z125 Encounter for screening for malignant neoplasm of prostate: Secondary | ICD-10-CM

## 2022-11-01 NOTE — Progress Notes (Signed)
Established Patient Office Visit  Subjective:  Patient ID: Terry Bruce, male    DOB: 12-28-1970  Age: 52 y.o. MRN: [id number]  CC:  Chief Complaint  Patient presents with   Referral    GI   Diabetes    HPI Terry Bruce presents for referral to GI  Pt only came in today because he wants a referral to GI for screening colonoscopy.  He is having no symptoms but states his younger sister was diagnosed recently and wanted to get one.  PT HAS NOT BEEN SEEN IN CLINIC IN OVER 2 YEARS - IS A NONCOMPLIANT DIABETIC and has not had workup /labwork or anything in over 2 years.  He is currently taking his father in laws Lantus that he started on his own about a year ago and states he has 'followed' his dosing giving himself 10 units in the mornings and 6-8 units at night.  He does not check his glucose at all despite having the equipment to do so .  Says the last time he checked it randomly was many months ago and it was over 250. Had a lengthy discussion about the risks of self treatment and noncompliance -- all of what have been discussed in past with patient.  He states he is willing to do labwork today in order for [place] to have some grasp where his values are and be able to manage accordingly  Past Medical History:  Diagnosis Date   Hypertension    Type 2 diabetes mellitus ([place])     History reviewed. No pertinent surgical history.  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  Tobacco Use   Smoking status: Never   Smokeless tobacco: Never  Vaping Use   Vaping Use: Never used  Substance and Sexual Activity   Alcohol use: Not Currently   Drug use: Never   Sexual  activity: Not on file  Other Topics Concern   Not on file  Social History Narrative   Not on file   Social Determinants of Health   Financial Resource Strain: Not on file  Food Insecurity: Not on file  Transportation Needs: Not on file  Physical Activity: Not on file  Stress: Not on file  Social Connections: Not on file  Intimate Partner Violence: Not on file     Current Outpatient Medications:    Blood Glucose Monitoring Suppl (CONTOUR NEXT MONITOR) w/Device KIT, Check glucose fasting in am and once after supper in pm, Disp: 1 kit, Rfl: 0   glucose blood test strip, Check glucose bid, Disp: 100 each, Rfl: 1   insulin glargine (LANTUS) 100 UNIT/ML injection, Inject 18 Units into the skin daily. 10 units in the AM and 6 - 8 units in the evenings., Disp: , Rfl:    Lancets MISC, Check glucose bid, Disp: 100 each, Rfl: 1   No Known Allergies  ROS CONSTITUTIONAL: Negative for chills, fatigue, fever, unintentional weight gain and unintentional weight loss.  E/N/T: Negative for ear pain, nasal congestion and sore throat.  CARDIOVASCULAR: Negative for chest pain, dizziness, palpitations and pedal edema.  RESPIRATORY: Negative  for recent cough and dyspnea.  GASTROINTESTINAL: Negative for abdominal pain, acid reflux symptoms, constipation, diarrhea, nausea and vomiting.  MSK: Negative for arthralgias and myalgias.      Objective:    PHYSICAL EXAM:   VS: BP 112/70 (BP Location: Right Arm, Patient Position: Sitting, Cuff Size: Normal)   Pulse 69   Temp 97.6 F (36.4 C) (Temporal)   Ht '5\' 6"'$  (1.676 m)   Wt 143 lb 3.2 oz (65 kg)   SpO2 98%   BMI 23.11 kg/m   GEN: Well nourished, well developed, in no acute distress  Cardiac: RRR; no murmurs, rubs, or gallops,no edema -  Respiratory:  normal respiratory rate and pattern with no distress - normal breath sounds with no rales, rhonchi, wheezes or rubs GI: normal bowel sounds, no masses or tenderness      Health Maintenance  Due  Topic Date Due   Diabetic kidney evaluation - Urine ACR  Never done   COLONOSCOPY (Pts 45-97yr Insurance coverage will need to be confirmed)  Never done   OPHTHALMOLOGY EXAM  03/31/2021   HEMOGLOBIN A1C  05/31/2021   FOOT EXAM  06/11/2021   INFLUENZA VACCINE  04/06/2022   Diabetic kidney evaluation - eGFR measurement  05/04/2022   COVID-19 Vaccine (3 - 2023-24 season) 05/07/2022   Zoster Vaccines- Shingrix (1 of 2) Never done   DTaP/Tdap/Td (2 - Td or Tdap) 07/04/2022    There are no preventive care reminders to display for this patient.  Lab Results  Component Value Date   TSH 0.544 05/04/2021   Lab Results  Component Value Date   WBC 6.2 05/04/2021   HGB 13.9 05/04/2021   HCT 39.8 05/04/2021   MCV 80.7 05/04/2021   PLT 340 05/04/2021   Lab Results  Component Value Date   NA 130 (L) 05/04/2021   K 4.3 05/04/2021   CO2 22 05/04/2021   GLUCOSE 412 (H) 05/04/2021   BUN 29 (H) 05/04/2021   CREATININE 1.24 05/04/2021   BILITOT 0.4 05/04/2021   ALKPHOS 60 05/04/2021   AST 15 05/04/2021   ALT 15 05/04/2021   PROT 7.3 05/04/2021   ALBUMIN 4.2 05/04/2021   CALCIUM 9.4 05/04/2021   ANIONGAP 13 05/04/2021   EGFR 82 11/28/2020   Lab Results  Component Value Date   CHOL 209 (H) 11/28/2020   Lab Results  Component Value Date   HDL 73 11/28/2020   Lab Results  Component Value Date   LDLCALC 127 (H) 11/28/2020   Lab Results  Component Value Date   TRIG 48 11/28/2020   Lab Results  Component Value Date   CHOLHDL 2.9 11/28/2020   Lab Results  Component Value Date   HGBA1C 12.3 (H) 11/28/2020      Assessment & Plan:   Problem List Items Addressed This Visit   None Visit Diagnoses     [place]controlled type 2 [place] with [place]    -  Primary   Relevant Orders   CBC with Differential/Platelet   Comprehensive metabolic panel   TSH   Lipid panel   Hemoglobin A1c WILL ADJUST MEDICATION ACCORDING TO RESULTS OF LABS AND FOLLOW UP IN  4 WEEKS - START CHECKING GLUCOSE FASTING REGULARLY   Screen for colon cancer       Relevant Orders   Ambulatory referral to Gastroenterology   Prostate cancer screening       Relevant Orders   PSA       No orders of the defined types  were placed in this encounter.   Follow-up: Return in about 4 weeks (around 11/29/2022) for FOLLOW UP.    SARA R Keegan Ducey, PA-C

## 2022-11-02 LAB — CBC WITH DIFFERENTIAL/PLATELET
Basophils Absolute: 0 10*3/uL (ref 0.0–0.2)
Basos: 1 %
EOS (ABSOLUTE): 0.4 10*3/uL (ref 0.0–0.4)
Eos: 6 %
Hematocrit: 44.3 % (ref 37.5–51.0)
Hemoglobin: 14.6 g/dL (ref 13.0–17.7)
Immature Grans (Abs): 0 10*3/uL (ref 0.0–0.1)
Immature Granulocytes: 0 %
Lymphocytes Absolute: 2.7 10*3/uL (ref 0.7–3.1)
Lymphs: 42 %
MCH: 28.2 pg (ref 26.6–33.0)
MCHC: 33 g/dL (ref 31.5–35.7)
MCV: 86 fL (ref 79–97)
Monocytes Absolute: 0.5 10*3/uL (ref 0.1–0.9)
Monocytes: 7 %
Neutrophils Absolute: 2.9 10*3/uL (ref 1.4–7.0)
Neutrophils: 44 %
Platelets: 362 10*3/uL (ref 150–450)
RBC: 5.18 x10E6/uL (ref 4.14–5.80)
RDW: 12.5 % (ref 11.6–15.4)
WBC: 6.4 10*3/uL (ref 3.4–10.8)

## 2022-11-02 LAB — CARDIOVASCULAR RISK ASSESSMENT

## 2022-11-02 LAB — COMPREHENSIVE METABOLIC PANEL
ALT: 30 IU/L (ref 0–44)
AST: 17 IU/L (ref 0–40)
Albumin/Globulin Ratio: 1.7 (ref 1.2–2.2)
Albumin: 4.5 g/dL (ref 4.1–5.1)
Alkaline Phosphatase: 122 IU/L — ABNORMAL HIGH (ref 44–121)
BUN/Creatinine Ratio: 16 (ref 9–20)
BUN: 23 mg/dL (ref 6–24)
Bilirubin Total: 0.3 mg/dL (ref 0.0–1.2)
CO2: 26 mmol/L (ref 20–29)
Calcium: 9.8 mg/dL (ref 8.7–10.2)
Chloride: 96 mmol/L (ref 96–106)
Creatinine, Ser: 1.4 mg/dL — ABNORMAL HIGH (ref 0.76–1.27)
Globulin, Total: 2.6 g/dL (ref 1.5–4.5)
Glucose: 351 mg/dL — ABNORMAL HIGH (ref 70–99)
Potassium: 4.3 mmol/L (ref 3.5–5.2)
Sodium: 137 mmol/L (ref 134–144)
Total Protein: 7.1 g/dL (ref 6.0–8.5)
eGFR: 61 mL/min/{1.73_m2} (ref >59)

## 2022-11-02 LAB — HEMOGLOBIN A1C
Est. average glucose Bld gHb Est-mCnc: >398 mg/dL
Hgb A1c MFr Bld: >15.5 % — ABNORMAL HIGH (ref 4.8–5.6)

## 2022-11-02 LAB — TSH: TSH: 2.88 u[IU]/mL (ref 0.450–4.500)

## 2022-11-02 LAB — LIPID PANEL
Chol/HDL Ratio: 3.5 ratio (ref 0.0–5.0)
Cholesterol, Total: 215 mg/dL — ABNORMAL HIGH (ref 100–199)
HDL: 61 mg/dL (ref >39)
LDL Chol Calc (NIH): 139 mg/dL — ABNORMAL HIGH (ref 0–99)
Triglycerides: 82 mg/dL (ref 0–149)
VLDL Cholesterol Cal: 15 mg/dL (ref 5–40)

## 2022-11-02 LAB — PSA: Prostate Specific Ag, Serum: 0.9 ng/mL (ref 0.0–4.0)

## 2022-11-04 ENCOUNTER — Other Ambulatory Visit: Payer: Self-pay | Admitting: Family Medicine

## 2022-11-04 ENCOUNTER — Other Ambulatory Visit: Payer: Self-pay

## 2022-11-04 DIAGNOSIS — Z1211 Encounter for screening for malignant neoplasm of colon: Secondary | ICD-10-CM

## 2022-11-04 DIAGNOSIS — E1165 Type 2 diabetes mellitus with hyperglycemia: Secondary | ICD-10-CM

## 2022-11-04 MED ORDER — ROSUVASTATIN CALCIUM 10 MG PO TABS: 10.0000 mg | ORAL_TABLET | Freq: Every day | ORAL | 0 refills | Status: DC

## 2022-11-09 ENCOUNTER — Encounter: Payer: Self-pay | Admitting: Gastroenterology

## 2022-11-09 MED ORDER — INSULIN GLARGINE 100 UNIT/ML ~~LOC~~ SOLN: SUBCUTANEOUS | 0 refills | Status: DC

## 2022-11-30 ENCOUNTER — Encounter: Payer: Self-pay | Admitting: Physician Assistant

## 2022-11-30 ENCOUNTER — Ambulatory Visit (INDEPENDENT_AMBULATORY_CARE_PROVIDER_SITE_OTHER): Payer: BC Managed Care – PPO | Admitting: Physician Assistant

## 2022-11-30 VITALS — BP 130/72 | HR 75 | Temp 97.0°F | Ht 65.0 in | Wt 148.0 lb

## 2022-11-30 DIAGNOSIS — R5383 Other fatigue: Secondary | ICD-10-CM | POA: Diagnosis not present

## 2022-11-30 DIAGNOSIS — R37 Sexual dysfunction, unspecified: Secondary | ICD-10-CM | POA: Diagnosis not present

## 2022-11-30 DIAGNOSIS — E1165 Type 2 diabetes mellitus with hyperglycemia: Secondary | ICD-10-CM | POA: Diagnosis not present

## 2022-11-30 DIAGNOSIS — Z23 Encounter for immunization: Secondary | ICD-10-CM

## 2022-11-30 DIAGNOSIS — Z794 Long term (current) use of insulin: Secondary | ICD-10-CM

## 2022-11-30 MED ORDER — INSULIN GLARGINE 100 UNIT/ML ~~LOC~~ SOLN: 25.0000 [IU] | Freq: Every day | SUBCUTANEOUS | 0 refills | Status: DC

## 2022-11-30 MED ORDER — RAMIPRIL 1.25 MG PO CAPS: 1.2500 mg | ORAL_CAPSULE | Freq: Every day | ORAL | 1 refills | Status: DC

## 2022-11-30 MED ORDER — SILDENAFIL CITRATE 50 MG PO TABS: 50.0000 mg | ORAL_TABLET | Freq: Every day | ORAL | 2 refills | Status: DC | PRN

## 2022-11-30 NOTE — Progress Notes (Signed)
Established Patient Office Visit  Subjective:  Patient ID: Terry Bruce, male    DOB: 10-Aug-1971  Age: 52 y.o. MRN: [id number]  CC:  Chief Complaint  Patient presents with   Diabetes    Started on lantus 4 weeks    HPI Terry Bruce presents for follow up of diabetes.  At last visit hgb A1c was above 15.5 --- he has been referred to endocrinologist but appt has not been set up yet.  He had said he was taking an average of almost 30 units daily of his father in laws insulin for the past year - however today with wife states he was taking more like 20 units daily (and taking without ever checking glucose) He was advised at that time to start at 30 units daily titrating up every 3 days however he states his sugar began dropping too low (70s-80s) and decreased insulin down.  He still has had some random readings between 70s-300s and fasting from 70s - upper 200s-300s He states however overall is feeling better  He did start rosuvastatin qd and is willing to start ACE therapy for kidney protection as well  Pt states that he has been having erectile dysfunction issues - he would like testosterone level checked - says he has some fatigue as well.  We will check into these levels but discussed all symptoms probably due to his uncontrolled diabetes and hopefully as glucose control improves those symptoms will improve also  Pt would like to update tetanus shot He would like to try viagra  Past Medical History:  Diagnosis Date   Hypertension    Type 2 diabetes mellitus ([place])     No past surgical history on file.  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  Tobacco Use    Smoking status: Never   Smokeless tobacco: Never  Vaping Use   Vaping Use: Never used  Substance and Sexual Activity   Alcohol use: Not Currently   Drug use: Never   Sexual activity: Not on file  Other Topics Concern   Not on file  Social History Narrative   Not on file   Social Determinants of Health   Financial Resource Strain: Not on file  Food Insecurity: Not on file  Transportation Needs: Not on file  Physical Activity: Not on file  Stress: Not on file  Social Connections: Not on file  Intimate Partner Violence: Not on file     Current Outpatient Medications:    insulin glargine (LANTUS) 100 UNIT/ML injection, Inject 0.25 mLs (25 Units total) into the skin daily., Disp: 10 mL, Rfl: 0   ramipril (ALTACE) 1.25 MG capsule, Take 1 capsule (1.25 mg total) by mouth daily., Disp: 90 capsule, Rfl: 1   sildenafil (VIAGRA) 50 MG tablet, Take 1 tablet (50 mg total) by mouth daily as needed for erectile dysfunction., Disp: 10 tablet, Rfl: 2   Blood Glucose Monitoring Suppl (CONTOUR NEXT MONITOR) w/Device KIT, Check glucose fasting in am and once after supper in pm, Disp: 1 kit, Rfl: 0   glucose blood test strip, Check glucose bid, Disp: 100  each, Rfl: 1   Lancets MISC, Check glucose bid, Disp: 100 each, Rfl: 1   rosuvastatin (CRESTOR) 10 MG tablet, Take 1 tablet (10 mg total) by mouth daily., Disp: 90 tablet, Rfl: 0   No Known Allergies  ROS CONSTITUTIONAL: see HPI E/N/T: Negative for ear pain, nasal congestion and sore throat.  CARDIOVASCULAR: Negative for chest pain, dizziness, palpitations and pedal edema.  RESPIRATORY: Negative for recent cough and dyspnea.  GASTROINTESTINAL: Negative for abdominal pain, acid reflux symptoms, constipation, diarrhea, nausea and vomiting.  GU - see HPI     Objective:    PHYSICAL EXAM:   VS: BP 130/72   Pulse 75   Temp (!) 97 F (36.1 C)   Ht 5\' 5"  (1.651 m)   Wt 148 lb (67.1 kg)   SpO2 99%   BMI 24.63 kg/m   GEN: Well  nourished, well developed, in no acute distress  Cardiac: RRR; no murmurs, rubs, or gallops,no edema -  Respiratory:  normal respiratory rate and pattern with no distress - normal breath sounds with no rales, rhonchi, wheezes or rubs Skin: warm and dry, no rash  Psych: euthymic mood, appropriate affect and demeanor  EKG normal    Health Maintenance Due  Topic Date Due   Diabetic kidney evaluation - Urine ACR  Never done   COLONOSCOPY (Pts 45-41yrs Insurance coverage will need to be confirmed)  Never done   OPHTHALMOLOGY EXAM  03/31/2021   FOOT EXAM  06/11/2021   DTaP/Tdap/Td (2 - Td or Tdap) 07/04/2022    There are no preventive care reminders to display for this patient.  Lab Results  Component Value Date   TSH 2.880 11/01/2022   Lab Results  Component Value Date   WBC 6.4 11/01/2022   HGB 14.6 11/01/2022   HCT 44.3 11/01/2022   MCV 86 11/01/2022   PLT 362 11/01/2022   Lab Results  Component Value Date   NA 137 11/01/2022   K 4.3 11/01/2022   CO2 26 11/01/2022   GLUCOSE 351 (H) 11/01/2022   BUN 23 11/01/2022   CREATININE 1.40 (H) 11/01/2022   BILITOT 0.3 11/01/2022   ALKPHOS 122 (H) 11/01/2022   AST 17 11/01/2022   ALT 30 11/01/2022   PROT 7.1 11/01/2022   ALBUMIN 4.5 11/01/2022   CALCIUM 9.8 11/01/2022   ANIONGAP 13 05/04/2021   EGFR 61 11/01/2022   Lab Results  Component Value Date   CHOL 215 (H) 11/01/2022   Lab Results  Component Value Date   HDL 61 11/01/2022   Lab Results  Component Value Date   LDLCALC 139 (H) 11/01/2022   Lab Results  Component Value Date   TRIG 82 11/01/2022   Lab Results  Component Value Date   CHOLHDL 3.5 11/01/2022   Lab Results  Component Value Date   HGBA1C >15.5 (H) 11/01/2022      Assessment & Plan:   Problem List Items Addressed This Visit       Endocrine   Type 2 diabetes mellitus with hyperglycemia, with long-term current use of insulin (HCC) - Primary   Relevant Medications   ramipril (ALTACE)  1.25 MG capsule   insulin glargine (LANTUS) 100 UNIT/ML injection Continue to monitor glucose and do lantus 25units qd   Other Relevant Orders   EKG 12-Lead   Comprehensive metabolic panel   Hemoglobin A1c   Testosterone,Free and Total   Microalbumin / creatinine urine ratio     Other   Other fatigue   Relevant Orders  EKG 12-Lead   Comprehensive metabolic panel   Testosterone,Free and Total   Sexual dysfunction   Relevant Medications   sildenafil (VIAGRA) 50 MG tablet   Other Relevant Orders   Testosterone,Free and Total   Other Visit Diagnoses     Need for diphtheria-tetanus-pertussis (Tdap) vaccine       Relevant Orders   Tdap vaccine greater than or equal to 7yo IM       Meds ordered this encounter  Medications   ramipril (ALTACE) 1.25 MG capsule    Sig: Take 1 capsule (1.25 mg total) by mouth daily.    Dispense:  90 capsule    Refill:  1    Order Specific Question:   Supervising Provider    Answer:   Shelton Silvas   insulin glargine (LANTUS) 100 UNIT/ML injection    Sig: Inject 0.25 mLs (25 Units total) into the skin daily.    Dispense:  10 mL    Refill:  0    Order Specific Question:   Supervising Provider    Answer:   Shelton Silvas   sildenafil (VIAGRA) 50 MG tablet    Sig: Take 1 tablet (50 mg total) by mouth daily as needed for erectile dysfunction.    Dispense:  10 tablet    Refill:  2    Order Specific Question:   Supervising Provider    Answer:   Shelton Silvas    Follow-up: Return in about 4 weeks (around 12/28/2022) for follow up at 1;30 appt - 30 min.    SARA R Lamone Ferrelli, PA-C

## 2022-12-01 LAB — COMPREHENSIVE METABOLIC PANEL
ALT: 19 IU/L (ref 0–44)
AST: 23 IU/L (ref 0–40)
Albumin/Globulin Ratio: 1.6 (ref 1.2–2.2)
Albumin: 4.2 g/dL (ref 4.1–5.1)
Alkaline Phosphatase: 91 IU/L (ref 44–121)
BUN/Creatinine Ratio: 15 (ref 9–20)
BUN: 21 mg/dL (ref 6–24)
Bilirubin Total: <0.2 mg/dL (ref 0.0–1.2)
CO2: 22 mmol/L (ref 20–29)
Calcium: 9.3 mg/dL (ref 8.7–10.2)
Chloride: 99 mmol/L (ref 96–106)
Creatinine, Ser: 1.36 mg/dL — ABNORMAL HIGH (ref 0.76–1.27)
Globulin, Total: 2.6 g/dL (ref 1.5–4.5)
Glucose: 419 mg/dL — ABNORMAL HIGH (ref 70–99)
Potassium: 4.7 mmol/L (ref 3.5–5.2)
Sodium: 134 mmol/L (ref 134–144)
Total Protein: 6.8 g/dL (ref 6.0–8.5)
eGFR: 63 mL/min/{1.73_m2} (ref >59)

## 2022-12-01 LAB — TESTOSTERONE,FREE AND TOTAL
Testosterone, Free: 12.6 pg/mL (ref 7.2–24.0)
Testosterone: 279 ng/dL (ref 264–916)

## 2022-12-01 LAB — MICROALBUMIN / CREATININE URINE RATIO
Creatinine, Urine: 35.8 mg/dL
Microalb/Creat Ratio: <8 mg/g creat (ref 0–29)
Microalbumin, Urine: <3 ug/mL

## 2022-12-01 LAB — HEMOGLOBIN A1C
Est. average glucose Bld gHb Est-mCnc: 349 mg/dL
Hgb A1c MFr Bld: 13.8 % — ABNORMAL HIGH (ref 4.8–5.6)

## 2022-12-30 ENCOUNTER — Encounter: Payer: Self-pay | Admitting: Physician Assistant

## 2022-12-30 ENCOUNTER — Ambulatory Visit (INDEPENDENT_AMBULATORY_CARE_PROVIDER_SITE_OTHER): Payer: BC Managed Care – PPO | Admitting: Physician Assistant

## 2022-12-30 VITALS — BP 107/70 | HR 77 | Temp 97.3°F | Ht 65.0 in | Wt 150.2 lb

## 2022-12-30 DIAGNOSIS — F988 Other specified behavioral and emotional disorders with onset usually occurring in childhood and adolescence: Secondary | ICD-10-CM

## 2022-12-30 DIAGNOSIS — E1165 Type 2 diabetes mellitus with hyperglycemia: Secondary | ICD-10-CM

## 2022-12-30 DIAGNOSIS — Z794 Long term (current) use of insulin: Secondary | ICD-10-CM

## 2022-12-30 DIAGNOSIS — R37 Sexual dysfunction, unspecified: Secondary | ICD-10-CM | POA: Diagnosis not present

## 2022-12-30 MED ORDER — ATOMOXETINE HCL 40 MG PO CAPS: 40.0000 mg | ORAL_CAPSULE | Freq: Every day | ORAL | 1 refills | Status: DC

## 2022-12-30 MED ORDER — INSULIN GLARGINE 100 UNIT/ML ~~LOC~~ SOLN: 20.0000 [IU] | Freq: Every day | SUBCUTANEOUS | 0 refills | Status: DC

## 2022-12-30 MED ORDER — RAMIPRIL 1.25 MG PO CAPS: 1.2500 mg | ORAL_CAPSULE | Freq: Every day | ORAL | 1 refills | Status: DC

## 2022-12-30 MED ORDER — SILDENAFIL CITRATE 100 MG PO TABS
50.0000 mg | ORAL_TABLET | Freq: Every day | ORAL | 5 refills | Status: DC | PRN
Start: 2022-12-30 — End: 2023-03-04

## 2022-12-30 NOTE — Progress Notes (Signed)
Established Patient Office Visit  Subjective:  Patient ID: Terry Bruce, male    DOB: October 10, 1970  Age: 52 y.o. MRN: [id number]  CC:  Chief Complaint  Patient presents with   Diabetes    HPI Terry Bruce presents for follow up of diabetes.  At last visit hgb A1c was 13.8 (down from above 15.5)--- he has been referred to endocrinologist but appt has not been set up yet.  He had said he was taking an average of almost 30 units daily of his father in laws insulin for the past year -we had adjusted insulin to 25 units daily and he has significanlty improved his diet - His fasting glucoses now according to his machine are ranging 50-70s and after meals in the low 200s - he states he has not had any hypoglycemic episodes but I am concerned he may since the readings have significantly decreased - recommend to go ahead and decrease to 20 units daily and to notify us if readings become lower or he has hypoglycemic readings He states however overall is feeling better  He did start rosuvastatin qd as well as altace 2.5mg   Pt states that he has been having erectile dysfunction issues -he was started on viagra  but he states he is having to take  to achieve and maintain erection  Pt would like to try medication for ADD - He states that he has had issues with trouble concentrating and completing tasks all the time.  Says his wife says it has been a problem for years.  Now has interfered at home and at work  Past Medical History:  Diagnosis Date   Hypertension    Type 2 diabetes mellitus     History reviewed. No pertinent surgical history.  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  Tobacco Use   Smoking status: Never   Smokeless tobacco: Never  Vaping Use   Vaping Use: Never used  Substance and Sexual Activity   Alcohol use: Not Currently   Drug use: Never   Sexual activity: Not on file  Other Topics Concern   Not on file  Social History Narrative   Not on file   Social Determinants of Health   Financial Resource Strain: Not on file  Food Insecurity: Not on file  Transportation Needs: Not on file  Physical Activity: Not on file  Stress: Not on file  Social Connections: Not on file  Intimate Partner Violence: Not on file     Current Outpatient Medications:    atomoxetine (STRATTERA) 40 MG capsule, Take 1 capsule (40 mg total) by mouth daily., Disp: 30 capsule, Rfl: 1   Blood Glucose Monitoring Suppl (CONTOUR NEXT MONITOR) w/Device KIT, Check glucose fasting in am and once after supper in pm, Disp: 1 kit, Rfl: 0   glucose blood test strip, Check glucose bid, Disp: 100 each, Rfl: 1   Lancets MISC, Check glucose bid, Disp: 100 each, Rfl: 1   rosuvastatin (CRESTOR) 10 MG tablet, Take 1 tablet (10 mg total) by mouth daily., Disp: 90 tablet, Rfl: 0   sildenafil (VIAGRA) 100  MG tablet, Take 0.5-1 tablets (50-100 mg total) by mouth daily as needed for erectile dysfunction., Disp: 10 tablet, Rfl: 5   insulin glargine (LANTUS) 100 UNIT/ML injection, Inject 0.2 mLs (20 Units total) into the skin daily., Disp: 10 mL, Rfl: 0   ramipril (ALTACE) 1.25 MG capsule, Take 1 capsule (1.25 mg total) by mouth daily., Disp: 90 capsule, Rfl: 1   No Known Allergies  CONSTITUTIONAL: Negative for chills, fatigue, fever, unintentional weight gain and unintentional weight loss.  CARDIOVASCULAR: Negative for chest pain, dizziness,   RESPIRATORY: Negative for recent cough and dyspnea.  GASTROINTESTINAL: Negative for abdominal pain, acid reflux symptoms, constipation, diarrhea, nausea and vomiting.  PSYCHIATRIC: see HPI    Objective:    PHYSICAL EXAM:    VS: BP 107/70 (BP Location: Left Arm, Patient Position: Sitting, Cuff Size: Large)   Pulse 77   Temp (!) 97.3 F (36.3 C) (Temporal)   Ht  (1.651 m)   Wt 150 lb 3.2 oz (68.1 kg)   SpO2 98%   BMI 24.99 kg/m   GEN: Well nourished, well developed, in no acute distress  Cardiac: RRR; no murmurs, rubs, or gallops,no edema -  Respiratory:  normal respiratory rate and pattern with no distress - normal breath sounds with no rales, rhonchi, wheezes or rubs Skin: warm and dry, no rash  Psych: euthymic mood, appropriate affect and demeanor   Health Maintenance Due  Topic Date Due   COLONOSCOPY (Pts 45-37yrs Insurance coverage will need to be confirmed)  Never done   OPHTHALMOLOGY EXAM  03/31/2021    There are no preventive care reminders to display for this patient.  Lab Results  Component Value Date   TSH 2.880 11/01/2022   Lab Results  Component Value Date   WBC 6.4 11/01/2022   HGB 14.6 11/01/2022   HCT 44.3 11/01/2022   MCV 86 11/01/2022   PLT 362 11/01/2022   Lab Results  Component Value Date   NA 134 11/30/2022   K 4.7 11/30/2022   CO2 22 11/30/2022   GLUCOSE 419 (H) 11/30/2022   BUN 21 11/30/2022   CREATININE 1.36 (H) 11/30/2022   BILITOT <0.2 11/30/2022   ALKPHOS 91 11/30/2022   AST 23 11/30/2022   ALT 19 11/30/2022   PROT 6.8 11/30/2022   ALBUMIN 4.2 11/30/2022   CALCIUM 9.3 11/30/2022   ANIONGAP 13 05/04/2021   EGFR 63 11/30/2022   Lab Results  Component Value Date   CHOL 215 (H) 11/01/2022   Lab Results  Component Value Date   HDL 61 11/01/2022   Lab Results  Component Value Date   LDLCALC 139 (H) 11/01/2022   Lab Results  Component Value Date   TRIG 82 11/01/2022   Lab Results  Component Value Date   CHOLHDL 3.5 11/01/2022   Lab Results  Component Value Date   HGBA1C 13.8 (H) 11/30/2022      Assessment & Plan:   Problem List Items Addressed This Visit       Endocrine   Type 2 diabetes mellitus with hyperglycemia, with  long-term current use of insulin (HCC) - Primary   Relevant Medications   ramipril (ALTACE) 1.25 MG capsule   insulin glargine (LANTUS) 100 UNIT/ML injection Continue to monitor glucose and do lantus 20units qd Endocrinology appt pending   Other Relevant Orders      Comprehensive metabolic panel   Hemoglobin A1c           Other   ADD   Rx for  strattera 40mg  qd            Sexual dysfunction   Relevant Medications   sildenafil (VIAGRA) 100 MG tablet         Other Visit Diagnoses                  Meds ordered this encounter  Medications   ramipril (ALTACE) 1.25 MG capsule    Sig: Take 1 capsule (1.25 mg total) by mouth daily.    Dispense:  90 capsule    Refill:  1    Order Specific Question:   Supervising Provider    Answer:   Corey Harold   sildenafil (VIAGRA) 100 MG tablet    Sig: Take 0.5-1 tablets (50-100 mg total) by mouth daily as needed for erectile dysfunction.    Dispense:  10 tablet    Refill:  5    Order Specific Question:   Supervising Provider    Answer:   Corey Harold   atomoxetine (STRATTERA) 40 MG capsule    Sig: Take 1 capsule (40 mg total) by mouth daily.    Dispense:  30 capsule    Refill:  1    Order Specific Question:   Supervising Provider    Answer:   Corey Harold   insulin glargine (LANTUS) 100 UNIT/ML injection    Sig: Inject 0.2 mLs (20 Units total) into the skin daily.    Dispense:  10 mL    Refill:  0    Order Specific Question:   Supervising Provider    Answer:   Corey Harold    Follow-up: Return in about 2 months (around 03/01/2023) for chronic fasting follow up.    SARA R Gesselle Fitzsimons, PA-C

## 2022-12-31 LAB — COMPREHENSIVE METABOLIC PANEL
ALT: 22 IU/L (ref 0–44)
AST: 25 IU/L (ref 0–40)
Albumin/Globulin Ratio: 1.4 (ref 1.2–2.2)
Albumin: 4.2 g/dL (ref 4.1–5.1)
Alkaline Phosphatase: 74 IU/L (ref 44–121)
BUN/Creatinine Ratio: 23 — ABNORMAL HIGH (ref 9–20)
BUN: 30 mg/dL — ABNORMAL HIGH (ref 6–24)
Bilirubin Total: 0.2 mg/dL (ref 0.0–1.2)
CO2: 23 mmol/L (ref 20–29)
Calcium: 9.4 mg/dL (ref 8.7–10.2)
Chloride: 102 mmol/L (ref 96–106)
Creatinine, Ser: 1.32 mg/dL — ABNORMAL HIGH (ref 0.76–1.27)
Globulin, Total: 3.1 g/dL (ref 1.5–4.5)
Glucose: 85 mg/dL (ref 70–99)
Potassium: 3.9 mmol/L (ref 3.5–5.2)
Sodium: 141 mmol/L (ref 134–144)
Total Protein: 7.3 g/dL (ref 6.0–8.5)
eGFR: 66 mL/min/{1.73_m2} (ref >59)

## 2022-12-31 LAB — HEMOGLOBIN A1C
Est. average glucose Bld gHb Est-mCnc: 341 mg/dL
Hgb A1c MFr Bld: 13.5 % — ABNORMAL HIGH (ref 4.8–5.6)

## 2023-01-03 ENCOUNTER — Ambulatory Visit: Payer: BC Managed Care – PPO | Admitting: Gastroenterology

## 2023-01-03 ENCOUNTER — Telehealth: Payer: Self-pay | Admitting: Gastroenterology

## 2023-01-03 NOTE — Telephone Encounter (Signed)
PT has rescheduled appointment for today for July. He had a scheduling conflict.

## 2023-01-03 NOTE — Telephone Encounter (Signed)
ok 

## 2023-01-23 ENCOUNTER — Other Ambulatory Visit: Payer: Self-pay | Admitting: Physician Assistant

## 2023-01-23 DIAGNOSIS — F988 Other specified behavioral and emotional disorders with onset usually occurring in childhood and adolescence: Secondary | ICD-10-CM

## 2023-01-31 ENCOUNTER — Other Ambulatory Visit: Payer: Self-pay | Admitting: Family Medicine

## 2023-03-04 ENCOUNTER — Encounter: Payer: Self-pay | Admitting: Physician Assistant

## 2023-03-04 ENCOUNTER — Ambulatory Visit (INDEPENDENT_AMBULATORY_CARE_PROVIDER_SITE_OTHER): Payer: BC Managed Care – PPO | Admitting: Physician Assistant

## 2023-03-04 VITALS — BP 110/80 | HR 71 | Temp 97.7°F | Resp 12 | Ht 65.0 in | Wt 152.0 lb

## 2023-03-04 DIAGNOSIS — Z794 Long term (current) use of insulin: Secondary | ICD-10-CM

## 2023-03-04 DIAGNOSIS — E1165 Type 2 diabetes mellitus with hyperglycemia: Secondary | ICD-10-CM

## 2023-03-04 DIAGNOSIS — F988 Other specified behavioral and emotional disorders with onset usually occurring in childhood and adolescence: Secondary | ICD-10-CM

## 2023-03-04 DIAGNOSIS — R37 Sexual dysfunction, unspecified: Secondary | ICD-10-CM | POA: Diagnosis not present

## 2023-03-04 LAB — TSH: TSH: 2.52 u[IU]/mL (ref 0.450–4.500)

## 2023-03-04 LAB — CBC WITH DIFFERENTIAL/PLATELET
Basophils Absolute: 0 10*3/uL (ref 0.0–0.2)
Basos: 0 %
EOS (ABSOLUTE): 0.2 10*3/uL (ref 0.0–0.4)
Eos: 3 %
Hematocrit: 45.9 % (ref 37.5–51.0)
Hemoglobin: 14.8 g/dL (ref 13.0–17.7)
Immature Grans (Abs): 0 10*3/uL (ref 0.0–0.1)
Immature Granulocytes: 0 %
Lymphocytes Absolute: 2.6 10*3/uL (ref 0.7–3.1)
Lymphs: 49 %
MCH: 27.6 pg (ref 26.6–33.0)
MCHC: 32.2 g/dL (ref 31.5–35.7)
MCV: 86 fL (ref 79–97)
Monocytes Absolute: 0.4 10*3/uL (ref 0.1–0.9)
Monocytes: 8 %
Neutrophils Absolute: 2.1 10*3/uL (ref 1.4–7.0)
Neutrophils: 40 %
Platelets: 293 10*3/uL (ref 150–450)
RBC: 5.37 x10E6/uL (ref 4.14–5.80)
RDW: 12.8 % (ref 11.6–15.4)
WBC: 5.2 10*3/uL (ref 3.4–10.8)

## 2023-03-04 LAB — COMPREHENSIVE METABOLIC PANEL
ALT: 32 IU/L (ref 0–44)
AST: 38 IU/L (ref 0–40)
Albumin: 4.1 g/dL (ref 4.1–5.1)
Alkaline Phosphatase: 72 IU/L (ref 44–121)
BUN/Creatinine Ratio: 23 — ABNORMAL HIGH (ref 9–20)
BUN: 32 mg/dL — ABNORMAL HIGH (ref 6–24)
Bilirubin Total: 0.4 mg/dL (ref 0.0–1.2)
CO2: 24 mmol/L (ref 20–29)
Calcium: 9.1 mg/dL (ref 8.7–10.2)
Chloride: 102 mmol/L (ref 96–106)
Creatinine, Ser: 1.38 mg/dL — ABNORMAL HIGH (ref 0.76–1.27)
Globulin, Total: 2.6 g/dL (ref 1.5–4.5)
Glucose: 92 mg/dL (ref 70–99)
Potassium: 3.8 mmol/L (ref 3.5–5.2)
Sodium: 140 mmol/L (ref 134–144)
Total Protein: 6.7 g/dL (ref 6.0–8.5)
eGFR: 62 mL/min/{1.73_m2} (ref >59)

## 2023-03-04 LAB — LIPID PANEL
Chol/HDL Ratio: 2.3 ratio (ref 0.0–5.0)
Cholesterol, Total: 157 mg/dL (ref 100–199)
HDL: 69 mg/dL (ref >39)
LDL Chol Calc (NIH): 79 mg/dL (ref 0–99)
Triglycerides: 40 mg/dL (ref 0–149)
VLDL Cholesterol Cal: 9 mg/dL (ref 5–40)

## 2023-03-04 LAB — HEMOGLOBIN A1C
Est. average glucose Bld gHb Est-mCnc: 309 mg/dL
Hgb A1c MFr Bld: 12.4 % — ABNORMAL HIGH (ref 4.8–5.6)

## 2023-03-04 MED ORDER — RAMIPRIL 1.25 MG PO CAPS
1.2500 mg | ORAL_CAPSULE | Freq: Every day | ORAL | 1 refills | Status: AC
Start: 2023-03-04 — End: ?

## 2023-03-04 MED ORDER — INSULIN GLARGINE 100 UNIT/ML ~~LOC~~ SOLN
20.0000 [IU] | Freq: Every day | SUBCUTANEOUS | 0 refills | Status: AC
Start: 2023-03-04 — End: ?

## 2023-03-04 MED ORDER — TADALAFIL 20 MG PO TABS
ORAL_TABLET | ORAL | 5 refills | Status: AC
Start: 2023-03-04 — End: ?

## 2023-03-04 MED ORDER — ROSUVASTATIN CALCIUM 10 MG PO TABS
10.0000 mg | ORAL_TABLET | Freq: Every day | ORAL | 1 refills | Status: AC
Start: 2023-03-04 — End: ?

## 2023-03-04 MED ORDER — AMPHETAMINE-DEXTROAMPHETAMINE 20 MG PO TABS
20.0000 mg | ORAL_TABLET | Freq: Every day | ORAL | 0 refills | Status: DC
Start: 2023-03-04 — End: 2023-05-16

## 2023-03-04 NOTE — Progress Notes (Signed)
Established Patient Office Visit  Subjective:  Patient ID: Terry Bruce, male    DOB: March 01, 1971  Age: 52 y.o. MRN: [id number]  CC:  Chief Complaint  Patient presents with   Medical Management of Chronic Issues    HPI Terry Bruce presents for follow up of diabetes.  At last visit hgb A1c was 13.8 --- he has been referred to endocrinologist and he thinks appt is next month.  We adjusted his insulin at last visit and decreased down to 20 units daily which he has continued.  He is more concerned that his glucose elevates after meals up to 180s but again explained to him that postprandial glucose will elevated and those readings are actually good.  He states his fasting glucose has ranged 50s-70s and has had some hypoglycemic episodes as well.  He is advised to decrease his insulin to 15 units daily and continue to monitor glucose - to let us know if still having low readings.  He is taking crestor 10mg  every day and altace 1.25mg  every day He is due for eye exam  Pt states that he has been having erectile dysfunction issues -he was given rx for viagra 100mg  but has tried friends cialis instead and says that works better for him and would like rx  Pt was tried on strattera for ADD - he states he tried several doses and it caused nausea/vomiting and he did not like the way it made him feel.  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  Tobacco Use   Smoking  status: Never   Smokeless tobacco: Never  Vaping Use   Vaping Use: Never used  Substance and Sexual Activity   Alcohol use: Not Currently   Drug use: Never   Sexual activity: Yes    Partners: Female  Other Topics Concern   Not on file  Social History Narrative   Not on file   Social Determinants of Health   Financial Resource Strain: Not on file  Food Insecurity: Not on file  Transportation Needs: Not on file  Physical Activity: Not on file  Stress: Not on file  Social Connections: Not on file  Intimate Partner Violence: Not on file     Current Outpatient Medications:    amphetamine-dextroamphetamine (ADDERALL) 20 MG tablet, Take 1 tablet (20 mg total) by mouth daily., Disp: 30 tablet, Rfl: 0   Blood Glucose Monitoring Suppl (CONTOUR NEXT MONITOR) w/Device KIT, Check glucose fasting in am and once after supper in pm, Disp: 1 kit, Rfl: 0   glucose blood test strip, Check glucose bid, Disp: 100 each, Rfl: 1   Lancets MISC, Check glucose bid, Disp: 100 each, Rfl: 1   tadalafil (CIALIS) 20 MG tablet, 1 po every day prn erectile dysfunction, Disp: 10 tablet, Rfl: 5   insulin glargine (LANTUS) 100 UNIT/ML injection, Inject  0.2 mLs (20 Units total) into the skin daily. Inject 15 units daily, Disp: 10 mL, Rfl: 0   ramipril (ALTACE) 1.25 MG capsule, Take 1 capsule (1.25 mg total) by mouth daily., Disp: 90 capsule, Rfl: 1   rosuvastatin (CRESTOR) 10 MG tablet, Take 1 tablet (10 mg total) by mouth daily., Disp: 90 tablet, Rfl: 1   No Known Allergies  CONSTITUTIONAL: Negative for chills, fatigue, fever, unintentional weight gain and unintentional weight loss.  CARDIOVASCULAR: Negative for chest pain, dizziness, palpitations and pedal edema.  RESPIRATORY: Negative for recent cough and dyspnea.  GASTROINTESTINAL: Negative for abdominal pain, acid reflux symptoms, constipation, diarrhea, nausea and vomiting.      Objective:    PHYSICAL EXAM:   VS: BP 110/80   Pulse 71   Temp 97.7  F (36.5 C)   Resp 12   Ht 5\' 5"  (1.651 m)   Wt 152 lb (68.9 kg)   SpO2 97%   BMI 25.29 kg/m   GEN: Well nourished, well developed, in no acute distress  Cardiac: RRR; no murmurs, rubs, or gallops,no edema -  Respiratory:  normal respiratory rate and pattern with no distress - normal breath sounds with no rales, rhonchi, wheezes or rubs Skin: warm and dry, no rash  Psych: euthymic mood, appropriate affect and demeanor    Health Maintenance Due  Topic Date Due   Colonoscopy  Never done   OPHTHALMOLOGY EXAM  03/31/2021    There are no preventive care reminders to display for this patient.  Lab Results  Component Value Date   TSH 2.880 11/01/2022   Lab Results  Component Value Date   WBC 6.4 11/01/2022   HGB 14.6 11/01/2022   HCT 44.3 11/01/2022   MCV 86 11/01/2022   PLT 362 11/01/2022   Lab Results  Component Value Date   NA 141 12/30/2022   K 3.9 12/30/2022   CO2 23 12/30/2022   GLUCOSE 85 12/30/2022   BUN 30 (H) 12/30/2022   CREATININE 1.32 (H) 12/30/2022   BILITOT 0.2 12/30/2022   ALKPHOS 74 12/30/2022   AST 25 12/30/2022   ALT 22 12/30/2022   PROT 7.3 12/30/2022   ALBUMIN 4.2 12/30/2022   CALCIUM 9.4 12/30/2022   ANIONGAP 13 05/04/2021   EGFR 66 12/30/2022   Lab Results  Component Value Date   CHOL 215 (H) 11/01/2022   Lab Results  Component Value Date   HDL 61 11/01/2022   Lab Results  Component Value Date   LDLCALC 139 (H) 11/01/2022   Lab Results  Component Value Date   TRIG 82 11/01/2022   Lab Results  Component Value Date   CHOLHDL 3.5 11/01/2022   Lab Results  Component Value Date   HGBA1C 13.5 (H) 12/30/2022      Assessment & Plan:   Problem List Items Addressed This Visit       Endocrine   Type 2 diabetes mellitus with hyperglycemia, with long-term current use of insulin (HCC) - Primary   Relevant Medications   ramipril (ALTACE) 1.25 MG capsule   insulin glargine (LANTUS) 100 UNIT/ML injection Decrease insulin to 15  units daily Endocrinology appt pending   Other Relevant Orders      Comprehensive metabolic panel   Hemoglobin A1c           Other   ADD   Rx for adderall 20mg  qd            Sexual dysfunction   Relevant Medications   Rx for adderall  20mg  qd         Other Visit Diagnoses                  Meds ordered this encounter  Medications   rosuvastatin (CRESTOR) 10 MG tablet    Sig: Take 1 tablet (10 mg total) by mouth daily.    Dispense:  90 tablet    Refill:  1    Order Specific Question:   Supervising Provider    Answer:   Corey Harold   ramipril (ALTACE) 1.25 MG capsule    Sig: Take 1 capsule (1.25 mg total) by mouth daily.    Dispense:  90 capsule    Refill:  1    Order Specific Question:   Supervising Provider    Answer:   Corey Harold   insulin glargine (LANTUS) 100 UNIT/ML injection    Sig: Inject 0.2 mLs (20 Units total) into the skin daily. Inject 15 units daily    Dispense:  10 mL    Refill:  0    Order Specific Question:   Supervising Provider    Answer:   Corey Harold   tadalafil (CIALIS) 20 MG tablet    Sig: 1 po every day prn erectile dysfunction    Dispense:  10 tablet    Refill:  5    Order Specific Question:   Supervising Provider    Answer:   Corey Harold   amphetamine-dextroamphetamine (ADDERALL) 20 MG tablet    Sig: Take 1 tablet (20 mg total) by mouth daily.    Dispense:  30 tablet    Refill:  0    Order Specific Question:   Supervising Provider    Answer:   Corey Harold    Follow-up: Return in about 3 months (around 06/04/2023) for chronic fasting follow-up.    SARA R Shaune Malacara, PA-C

## 2023-03-30 ENCOUNTER — Encounter: Payer: Self-pay | Admitting: Gastroenterology

## 2023-03-30 ENCOUNTER — Ambulatory Visit (INDEPENDENT_AMBULATORY_CARE_PROVIDER_SITE_OTHER): Payer: BC Managed Care – PPO | Admitting: Gastroenterology

## 2023-03-30 VITALS — BP 118/80 | HR 60 | Ht 66.0 in | Wt 151.0 lb

## 2023-03-30 DIAGNOSIS — Z1211 Encounter for screening for malignant neoplasm of colon: Secondary | ICD-10-CM

## 2023-03-30 NOTE — Patient Instructions (Signed)
_______________________________________________________  If your blood pressure at your visit was 140/90 or greater, please contact your primary care physician to follow up on this.  _______________________________________________________  If you are age 51 or older, your body mass index should be between 23-30. Your Body mass index is 24.37 kg/m. If this is out of the aforementioned range listed, please consider follow up with your Primary Care Provider.  If you are age 32 or younger, your body mass index should be between 19-25. Your Body mass index is 24.37 kg/m. If this is out of the aformentioned range listed, please consider follow up with your Primary Care Provider.   ________________________________________________________  The [facility] providers would like to encourage you to use [facility] to communicate with providers for non-urgent requests or questions.  Due to long hold times on the telephone, sending your provider a message by [facility] may be a faster and more efficient way to get a response.  Please allow 48 business hours for a response.  Please remember that this is for non-urgent requests.  _______________________________________________________  Bonita Quin have been scheduled for a colonoscopy. Please follow written instructions given to you at your visit today.   Please pick up your prep supplies at the pharmacy within the next 1-3 days.  If you use inhalers (even only as needed), please bring them with you on the day of your procedure.  DO NOT TAKE 7 DAYS PRIOR TO TEST- Trulicity (dulaglutide) Ozempic, Wegovy (semaglutide) Mounjaro (tirzepatide) Bydureon Bcise (exanatide extended release)  DO NOT TAKE 1 DAY PRIOR TO YOUR TEST Rybelsus (semaglutide) Adlyxin (lixisenatide) Victoza (liraglutide) Byetta (exanatide) ___________________________________________________________________________  Please call with any questions or concerns  Thank you,  Dr. Lynann Bologna

## 2023-03-30 NOTE — Progress Notes (Signed)
Chief Complaint: For CRC screening  Referring Provider:  Marianne Sofia, PA-C      ASSESSMENT AND PLAN;   #1. CRC screening  Plan: -Colon with miralax   HPI:    Terry Bruce is a 52 y.o. male  With DM, HLD, HTN, CKD  No nausea, vomiting, heartburn, regurgitation, odynophagia or dysphagia.  No significant diarrhea or constipation.  No melena or hematochezia. No unintentional weight loss. No abdominal pain.  No FH CRC  Past Medical History:  Diagnosis Date   Hypertension    Type 2 diabetes mellitus (HCC)     Past Surgical History:  Procedure Laterality Date   NO PAST SURGERIES      Family History  Problem Relation Age of Onset   Diabetes Mellitus II Mother    Arthritis Father    Liver disease Neg Hx    Esophageal cancer Neg Hx    Colon cancer Neg Hx     Social History   Tobacco Use   Smoking status: Never   Smokeless tobacco: Never  Vaping Use   Vaping status: Never Used  Substance Use Topics   Alcohol use: Not Currently   Drug use: Never    Current Outpatient Medications  Medication Sig Dispense Refill   amphetamine-dextroamphetamine (ADDERALL) 20 MG tablet Take 1 tablet (20 mg total) by mouth daily. 30 tablet 0   Blood Glucose Monitoring Suppl (CONTOUR NEXT MONITOR) w/Device KIT Check glucose fasting in am and once after supper in pm 1 kit 0   glucose blood test strip Check glucose bid 100 each 1   insulin glargine (LANTUS) 100 UNIT/ML injection Inject 0.2 mLs (20 Units total) into the skin daily. Inject 15 units daily 10 mL 0   Lancets MISC Check glucose bid 100 each 1   ramipril (ALTACE) 1.25 MG capsule Take 1 capsule (1.25 mg total) by mouth daily. 90 capsule 1   rosuvastatin (CRESTOR) 10 MG tablet Take 1 tablet (10 mg total) by mouth daily. 90 tablet 1   tadalafil (CIALIS) 20 MG tablet 1 po every day prn erectile dysfunction 10 tablet 5   No current facility-administered medications for this visit.    No Known Allergies  Review of Systems:   Constitutional: Denies fever, chills, diaphoresis, appetite change and fatigue.  HEENT: Denies photophobia, eye pain, redness, hearing loss, ear pain, congestion, sore throat, rhinorrhea, sneezing, mouth sores, neck pain, neck stiffness and tinnitus.   Respiratory: Denies SOB, DOE, cough, chest tightness,  and wheezing.   Cardiovascular: Denies chest pain, palpitations and leg swelling.  Genitourinary: Denies dysuria, urgency, frequency, hematuria, flank pain and difficulty urinating.  Musculoskeletal: Denies myalgias, back pain, joint swelling, arthralgias and gait problem.  Skin: No rash.  Neurological: Denies dizziness, seizures, syncope, weakness, light-headedness, numbness and headaches.  Hematological: Denies adenopathy. Easy bruising, personal or family bleeding history  Psychiatric/Behavioral: No anxiety or depression     Physical Exam:    BP 118/80   Pulse 60   Ht 5\' 6"  (1.676 m)   Wt 151 lb (68.5 kg)   BMI 24.37 kg/m  Wt Readings from Last 3 Encounters:  03/30/23 151 lb (68.5 kg)  03/04/23 152 lb (68.9 kg)  12/30/22 150 lb 3.2 oz (68.1 kg)   Constitutional:  Well-developed, in no acute distress. Psychiatric: Normal mood and affect. Behavior is normal. HEENT: Pupils normal.  Conjunctivae are normal. No scleral icterus. Neck supple.  Cardiovascular: Normal rate, regular rhythm. No edema Pulmonary/chest: Effort normal and breath sounds normal.  No wheezing, rales or rhonchi. Abdominal: Soft, nondistended. Nontender. Bowel sounds active throughout. There are no masses palpable. No hepatomegaly. Rectal: Deferred Neurological: Alert and oriented to person place and time. Skin: Skin is warm and dry. No rashes noted.  Data Reviewed: I have personally reviewed following labs and imaging studies  CBC:    Latest Ref Rng & Units 03/04/2023   11:35 AM 11/01/2022    2:01 PM 05/04/2021    4:29 PM  CBC  WBC 3.4 - 10.8 x10E3/uL 5.2  6.4  6.2   Hemoglobin 13.0 - 17.7 g/dL 78.2   95.6  21.3   Hematocrit 37.5 - 51.0 % 45.9  44.3  39.8   Platelets 150 - 450 x10E3/uL 293  362  340     CMP:    Latest Ref Rng & Units 03/04/2023   11:35 AM 12/30/2022    1:49 PM 11/30/2022    2:29 PM  CMP  Glucose 70 - 99 mg/dL 92  85  086   BUN 6 - 24 mg/dL 32  30  21   Creatinine 0.76 - 1.27 mg/dL 5.78  4.69  6.29   Sodium 134 - 144 mmol/L 140  141  134   Potassium 3.5 - 5.2 mmol/L 3.8  3.9  4.7   Chloride 96 - 106 mmol/L 102  102  99   CO2 20 - 29 mmol/L 24  23  22    Calcium 8.7 - 10.2 mg/dL 9.1  9.4  9.3   Total Protein 6.0 - 8.5 g/dL 6.7  7.3  6.8   Total Bilirubin 0.0 - 1.2 mg/dL 0.4  0.2  <5.2   Alkaline Phos 44 - 121 IU/L 72  74  91   AST 0 - 40 IU/L 38  25  23   ALT 0 - 44 IU/L 32  22  19         Edman Circle, MD 03/30/2023, 11:05 AM  Cc: Marianne Sofia, PA-C

## 2023-04-22 DIAGNOSIS — E11649 Type 2 diabetes mellitus with hypoglycemia without coma: Secondary | ICD-10-CM | POA: Diagnosis not present

## 2023-04-22 DIAGNOSIS — N182 Chronic kidney disease, stage 2 (mild): Secondary | ICD-10-CM | POA: Diagnosis not present

## 2023-04-22 DIAGNOSIS — E1165 Type 2 diabetes mellitus with hyperglycemia: Secondary | ICD-10-CM | POA: Diagnosis not present

## 2023-04-22 DIAGNOSIS — I10 Essential (primary) hypertension: Secondary | ICD-10-CM | POA: Diagnosis not present

## 2023-05-16 ENCOUNTER — Other Ambulatory Visit: Payer: Self-pay | Admitting: Physician Assistant

## 2023-05-16 DIAGNOSIS — F988 Other specified behavioral and emotional disorders with onset usually occurring in childhood and adolescence: Secondary | ICD-10-CM

## 2023-05-17 MED ORDER — AMPHETAMINE-DEXTROAMPHETAMINE 20 MG PO TABS
20.0000 mg | ORAL_TABLET | Freq: Every day | ORAL | 0 refills | Status: DC
Start: 2023-05-17 — End: 2023-07-07

## 2023-05-18 ENCOUNTER — Encounter: Payer: BC Managed Care – PPO | Admitting: Gastroenterology

## 2023-06-06 DIAGNOSIS — E11649 Type 2 diabetes mellitus with hypoglycemia without coma: Secondary | ICD-10-CM | POA: Diagnosis not present

## 2023-06-06 DIAGNOSIS — E1165 Type 2 diabetes mellitus with hyperglycemia: Secondary | ICD-10-CM | POA: Diagnosis not present

## 2023-06-06 DIAGNOSIS — N183 Chronic kidney disease, stage 3 unspecified: Secondary | ICD-10-CM | POA: Diagnosis not present

## 2023-06-06 DIAGNOSIS — I10 Essential (primary) hypertension: Secondary | ICD-10-CM | POA: Diagnosis not present

## 2023-06-13 ENCOUNTER — Ambulatory Visit: Payer: BC Managed Care – PPO | Admitting: Physician Assistant

## 2023-06-15 ENCOUNTER — Ambulatory Visit: Payer: BC Managed Care – PPO | Admitting: Gastroenterology

## 2023-06-15 ENCOUNTER — Encounter: Payer: Self-pay | Admitting: Gastroenterology

## 2023-06-15 VITALS — BP 101/51 | HR 83 | Temp 98.6°F | Resp 13 | Ht 66.0 in | Wt 151.0 lb

## 2023-06-15 DIAGNOSIS — Z1211 Encounter for screening for malignant neoplasm of colon: Secondary | ICD-10-CM

## 2023-06-15 DIAGNOSIS — Z800 Family history of malignant neoplasm of digestive organs: Secondary | ICD-10-CM | POA: Diagnosis not present

## 2023-06-15 MED ORDER — SODIUM CHLORIDE 0.9 % IV SOLN: 500.0000 mL | Freq: Once | INTRAVENOUS | Status: DC

## 2023-06-15 NOTE — Progress Notes (Signed)
Sedate, gd SR, tolerated procedure well, VSS, report to RN 

## 2023-06-15 NOTE — Patient Instructions (Signed)
Discharge instructions given. Handout on Hemorrhoids. Resume previous medications. YOU HAD AN ENDOSCOPIC PROCEDURE TODAY AT THE [facility]:   Refer to the procedure report that was given to you for any specific questions about what was found during the examination.  If the procedure report does not answer your questions, please call your gastroenterologist to clarify.  If you requested that your care partner not be given the details of your procedure findings, then the procedure report has been included in a sealed envelope for you to review at your convenience later.  YOU SHOULD EXPECT: Some feelings of bloating in the abdomen. Passage of more gas than usual.  Walking can help get rid of the air that was put into your GI tract during the procedure and reduce the bloating. If you had a lower endoscopy (such as a colonoscopy or flexible sigmoidoscopy) you may notice spotting of blood in your stool or on the toilet paper. If you underwent a bowel prep for your procedure, you may not have a normal bowel movement for a few days.  Please Note:  You might notice some irritation and congestion in your nose or some drainage.  This is from the oxygen used during your procedure.  There is no need for concern and it should clear up in a day or so.  SYMPTOMS TO REPORT IMMEDIATELY:  Following lower endoscopy (colonoscopy or flexible sigmoidoscopy):  Excessive amounts of blood in the stool  Significant tenderness or worsening of abdominal pains  Swelling of the abdomen that is new, acute  Fever of 100F or higher   For urgent or emergent issues, a gastroenterologist can be reached at any hour by calling [phone]. Do not use MyChart messaging for urgent concerns.    DIET:  We do recommend a small meal at first, but then you may proceed to your regular diet.  Drink plenty of fluids but you should avoid alcoholic beverages for 24 hours.  ACTIVITY:  You should plan to take it easy for the  rest of today and you should NOT DRIVE or use heavy machinery until tomorrow (because of the sedation medicines used during the test).    FOLLOW UP: Our staff will call the number listed on your records the next business day following your procedure.  We will call around 7:15- 8:00 am to check on you and address any questions or concerns that you may have regarding the information given to you following your procedure. If we do not reach you, we will leave a message.     If any biopsies were taken you will be contacted by phone or by letter within the next 1-3 weeks.  Please call us at [phone] if you have not heard about the biopsies in 3 weeks.    SIGNATURES/CONFIDENTIALITY: You and/or your care partner have signed paperwork which will be entered into your electronic medical record.  These signatures attest to the fact that that the information above on your After Visit Summary has been reviewed and is understood.  Full responsibility of the confidentiality of this discharge information lies with you and/or your care-partner. 

## 2023-06-15 NOTE — Op Note (Signed)
[facility] Patient Name: Terry Bruce Procedure Date: 06/15/2023 2:17 PM MRN: [id number] Endoscopist: Lynann Bologna , MD, [id number] Age: 52 Referring MD:  Date of Birth: [birth year] Gender: Male Account #: [id number] Procedure:                Colonoscopy Indications:              Screening in patient at increased risk: Colorectal                            cancer in sister before age 32 Medicines:                Monitored Anesthesia Care Procedure:                Pre-Anesthesia Assessment:                           - Prior to the procedure, a History and Physical                            was performed, and patient medications and                            allergies were reviewed. The patient's tolerance of                            previous anesthesia was also reviewed. The risks                            and benefits of the procedure and the sedation                            options and risks were discussed with the patient.                            All questions were answered, and informed consent                            was obtained. Prior Anticoagulants: The patient has                            taken no anticoagulant or antiplatelet agents. ASA                            Grade Assessment: II - A patient with mild systemic                            disease. After reviewing the risks and benefits,                            the patient was deemed in satisfactory condition to                            undergo the procedure.  After obtaining informed consent, the colonoscope                            was passed under direct vision. Throughout the                            procedure, the patient's blood pressure, pulse, and                            oxygen saturations were monitored continuously. The                            Olympus PCF-[id number] (#[id number]) Colonoscope was                            introduced through the anus and  advanced to the 2                            cm into the ileum. The colonoscopy was performed                            without difficulty. The patient tolerated the                            procedure well. The quality of the bowel                            preparation was good. The terminal ileum, ileocecal                            valve, appendiceal orifice, and rectum were                            photographed. Scope In: 2:24:53 PM Scope Out: 2:38:05 PM Scope Withdrawal Time: 0 hours 9 minutes 6 seconds  Total Procedure Duration: 0 hours 13 minutes 12 seconds  Findings:                 The colon (entire examined portion) appeared normal.                           Non-bleeding internal hemorrhoids were found during                            retroflexion. The hemorrhoids were small and Grade                            I (internal hemorrhoids that do not prolapse).                           The terminal ileum appeared normal.                           The exam was otherwise without abnormality on  direct and retroflexion views. Complications:            No immediate complications. Estimated Blood Loss:     Estimated blood loss: none. Impression:               - Minimal internal hemorrhoids.                           - The examined portion of the ileum was normal.                           - The examination was otherwise normal on direct                            and retroflexion views.                           - No specimens collected. Recommendation:           - Patient has a contact number available for                            emergencies. The signs and symptoms of potential                            delayed complications were discussed with the                            patient. Return to normal activities tomorrow.                            Written discharge instructions were provided to the                            patient.                            - Resume previous diet.                           - Continue present medications.                           - Repeat colonoscopy in 5 years for screening                            purposes d/t FH. Earlier, if with any new problems                            or change in family history.                           - The findings and recommendations were discussed                            with the patient's family. Lynann Bologna, MD 06/15/2023 2:41:56 PM This report has been signed electronically.

## 2023-06-15 NOTE — Progress Notes (Signed)
Chief Complaint: For CRC screening  Referring Provider:  Marianne Sofia, PA-C      ASSESSMENT AND PLAN;   #1. CRC screening/ FH CRC Sister at age 51 with stage I colon cancer (new information)  Plan: -Colon with miralax   HPI:    Terry Bruce is a 52 y.o. male  With DM, HLD, HTN, CKD  No nausea, vomiting, heartburn, regurgitation, odynophagia or dysphagia.  No significant diarrhea or constipation.  No melena or hematochezia. No unintentional weight loss. No abdominal pain.  No FH CRC  Past Medical History:  Diagnosis Date   Hypertension    Type 2 diabetes mellitus (HCC)     Past Surgical History:  Procedure Laterality Date   NO PAST SURGERIES      Family History  Problem Relation Age of Onset   Diabetes Mellitus II Mother    Arthritis Father    Colon cancer Sister    Liver disease Neg Hx    Esophageal cancer Neg Hx    Rectal cancer Neg Hx    Stomach cancer Neg Hx     Social History   Tobacco Use   Smoking status: Never   Smokeless tobacco: Never  Vaping Use   Vaping status: Never Used  Substance Use Topics   Alcohol use: Yes    Comment: special occasions only   Drug use: Never    Current Outpatient Medications  Medication Sig Dispense Refill   Blood Glucose Monitoring Suppl (CONTOUR NEXT MONITOR) w/Device KIT Check glucose fasting in am and once after supper in pm 1 kit 0   HUMALOG KWIKPEN 100 UNIT/ML KwikPen Inject 3-8 Units into the skin 3 (three) times daily. Dose per sliding scale     insulin glargine (LANTUS) 100 UNIT/ML injection Inject 0.2 mLs (20 Units total) into the skin daily. Inject 15 units daily 10 mL 0   ramipril (ALTACE) 1.25 MG capsule Take 1 capsule (1.25 mg total) by mouth daily. 90 capsule 1   rosuvastatin (CRESTOR) 10 MG tablet Take 1 tablet (10 mg total) by mouth daily. 90 tablet 1   amphetamine-dextroamphetamine (ADDERALL) 20 MG tablet Take 1 tablet (20 mg total) by mouth daily. 30 tablet 0   glucose blood test strip Check  glucose bid (Patient not taking: Reported on 06/15/2023) 100 each 1   Lancets MISC Check glucose bid 100 each 1   tadalafil (CIALIS) 20 MG tablet 1 po every day prn erectile dysfunction 10 tablet 5   No current facility-administered medications for this visit.    No Known Allergies  Review of Systems:  Constitutional: Denies fever, chills, diaphoresis, appetite change and fatigue.  HEENT: Denies photophobia, eye pain, redness, hearing loss, ear pain, congestion, sore throat, rhinorrhea, sneezing, mouth sores, neck pain, neck stiffness and tinnitus.   Respiratory: Denies SOB, DOE, cough, chest tightness,  and wheezing.   Cardiovascular: Denies chest pain, palpitations and leg swelling.  Genitourinary: Denies dysuria, urgency, frequency, hematuria, flank pain and difficulty urinating.  Musculoskeletal: Denies myalgias, back pain, joint swelling, arthralgias and gait problem.  Skin: No rash.  Neurological: Denies dizziness, seizures, syncope, weakness, light-headedness, numbness and headaches.  Hematological: Denies adenopathy. Easy bruising, personal or family bleeding history  Psychiatric/Behavioral: No anxiety or depression     Physical Exam:    BP 105/76   Pulse 74   Temp 98.6 F (37 C) (Other (Comment)) Comment (Src): forehead  Ht 5\' 6"  (1.676 m)   Wt 151 lb (68.5 kg)   SpO2 98%  BMI 24.37 kg/m  Wt Readings from Last 3 Encounters:  06/15/23 151 lb (68.5 kg)  03/30/23 151 lb (68.5 kg)  03/04/23 152 lb (68.9 kg)   Constitutional:  Well-developed, in no acute distress. Psychiatric: Normal mood and affect. Behavior is normal. HEENT: Pupils normal.  Conjunctivae are normal. No scleral icterus. Neck supple.  Cardiovascular: Normal rate, regular rhythm. No edema Pulmonary/chest: Effort normal and breath sounds normal. No wheezing, rales or rhonchi. Abdominal: Soft, nondistended. Nontender. Bowel sounds active throughout. There are no masses palpable. No hepatomegaly. Rectal:  Deferred Neurological: Alert and oriented to person place and time. Skin: Skin is warm and dry. No rashes noted.  Data Reviewed: I have personally reviewed following labs and imaging studies  CBC:    Latest Ref Rng & Units 03/04/2023   11:35 AM 11/01/2022    2:01 PM 05/04/2021    4:29 PM  CBC  WBC 3.4 - 10.8 x10E3/uL 5.2  6.4  6.2   Hemoglobin 13.0 - 17.7 g/dL 40.9  81.1  91.4   Hematocrit 37.5 - 51.0 % 45.9  44.3  39.8   Platelets 150 - 450 x10E3/uL 293  362  340     CMP:    Latest Ref Rng & Units 03/04/2023   11:35 AM 12/30/2022    1:49 PM 11/30/2022    2:29 PM  CMP  Glucose 70 - 99 mg/dL 92  85  782   BUN 6 - 24 mg/dL 32  30  21   Creatinine 0.76 - 1.27 mg/dL 9.56  2.13  0.86   Sodium 134 - 144 mmol/L 140  141  134   Potassium 3.5 - 5.2 mmol/L 3.8  3.9  4.7   Chloride 96 - 106 mmol/L 102  102  99   CO2 20 - 29 mmol/L 24  23  22    Calcium 8.7 - 10.2 mg/dL 9.1  9.4  9.3   Total Protein 6.0 - 8.5 g/dL 6.7  7.3  6.8   Total Bilirubin 0.0 - 1.2 mg/dL 0.4  0.2  <5.7   Alkaline Phos 44 - 121 IU/L 72  74  91   AST 0 - 40 IU/L 38  25  23   ALT 0 - 44 IU/L 32  22  19         Edman Circle, MD 06/15/2023, 2:14 PM  Cc: Marianne Sofia, PA-C

## 2023-06-16 ENCOUNTER — Telehealth: Payer: Self-pay | Admitting: *Deleted

## 2023-06-16 NOTE — Telephone Encounter (Signed)
No answer on  follow up call. Left message.   

## 2023-06-26 ENCOUNTER — Other Ambulatory Visit: Payer: Self-pay | Admitting: Physician Assistant

## 2023-06-26 DIAGNOSIS — F988 Other specified behavioral and emotional disorders with onset usually occurring in childhood and adolescence: Secondary | ICD-10-CM

## 2023-06-27 DIAGNOSIS — E119 Type 2 diabetes mellitus without complications: Secondary | ICD-10-CM | POA: Diagnosis not present

## 2023-07-04 ENCOUNTER — Other Ambulatory Visit: Payer: Self-pay

## 2023-07-04 DIAGNOSIS — F988 Other specified behavioral and emotional disorders with onset usually occurring in childhood and adolescence: Secondary | ICD-10-CM

## 2023-07-04 NOTE — Telephone Encounter (Signed)
Wife called and stated that his refill for his adderall was denied, wanted to know if it was due to him needing an appointment stated she talked to you at her last visit about making his appointment later due to having to go to all the specialist. Please advise

## 2023-07-04 NOTE — Telephone Encounter (Signed)
Called left detailed message to call office and schedule appointment

## 2023-07-04 NOTE — Telephone Encounter (Signed)
Notify this is a controlled substance and has to make follow up appt and then will send in refill --- need to make in the next few weeks

## 2023-07-05 ENCOUNTER — Other Ambulatory Visit: Payer: Self-pay | Admitting: Physician Assistant

## 2023-07-05 DIAGNOSIS — F988 Other specified behavioral and emotional disorders with onset usually occurring in childhood and adolescence: Secondary | ICD-10-CM

## 2023-07-07 ENCOUNTER — Other Ambulatory Visit: Payer: Self-pay | Admitting: Physician Assistant

## 2023-07-07 DIAGNOSIS — F988 Other specified behavioral and emotional disorders with onset usually occurring in childhood and adolescence: Secondary | ICD-10-CM

## 2023-07-08 MED ORDER — AMPHETAMINE-DEXTROAMPHETAMINE 20 MG PO TABS: 20.0000 mg | ORAL_TABLET | Freq: Every day | ORAL | 0 refills | Status: AC

## 2023-08-03 ENCOUNTER — Ambulatory Visit: Payer: BC Managed Care – PPO | Admitting: Physician Assistant

## 2023-08-24 ENCOUNTER — Ambulatory Visit: Payer: BC Managed Care – PPO | Admitting: Physician Assistant

## 2023-10-30 ENCOUNTER — Other Ambulatory Visit: Payer: Self-pay | Admitting: Physician Assistant

## 2023-10-30 DIAGNOSIS — E1165 Type 2 diabetes mellitus with hyperglycemia: Secondary | ICD-10-CM

## 2023-10-30 DIAGNOSIS — Z794 Long term (current) use of insulin: Secondary | ICD-10-CM
